# Patient Record
Sex: Female | Born: 1960 | Race: White | Hispanic: No | Marital: Married | Smoking: Former smoker
Health system: Southern US, Community
[De-identification: ages and names within clinical notes are randomized; demographics above are authoritative.]

## PROBLEM LIST (undated history)

## (undated) DIAGNOSIS — M1990 Unspecified osteoarthritis, unspecified site: Secondary | ICD-10-CM

## (undated) DIAGNOSIS — J189 Pneumonia, unspecified organism: Secondary | ICD-10-CM

## (undated) DIAGNOSIS — R51 Headache: Secondary | ICD-10-CM

## (undated) DIAGNOSIS — Z9889 Other specified postprocedural states: Secondary | ICD-10-CM

## (undated) DIAGNOSIS — F32A Depression, unspecified: Secondary | ICD-10-CM

## (undated) DIAGNOSIS — K219 Gastro-esophageal reflux disease without esophagitis: Secondary | ICD-10-CM

## (undated) DIAGNOSIS — M797 Fibromyalgia: Secondary | ICD-10-CM

## (undated) DIAGNOSIS — R002 Palpitations: Secondary | ICD-10-CM

## (undated) DIAGNOSIS — M419 Scoliosis, unspecified: Secondary | ICD-10-CM

## (undated) DIAGNOSIS — F419 Anxiety disorder, unspecified: Secondary | ICD-10-CM

## (undated) DIAGNOSIS — E781 Pure hyperglyceridemia: Secondary | ICD-10-CM

## (undated) DIAGNOSIS — E039 Hypothyroidism, unspecified: Secondary | ICD-10-CM

## (undated) DIAGNOSIS — F988 Other specified behavioral and emotional disorders with onset usually occurring in childhood and adolescence: Secondary | ICD-10-CM

## (undated) DIAGNOSIS — A4902 Methicillin resistant Staphylococcus aureus infection, unspecified site: Secondary | ICD-10-CM

## (undated) DIAGNOSIS — G4700 Insomnia, unspecified: Secondary | ICD-10-CM

## (undated) DIAGNOSIS — N289 Disorder of kidney and ureter, unspecified: Secondary | ICD-10-CM

## (undated) DIAGNOSIS — F329 Major depressive disorder, single episode, unspecified: Secondary | ICD-10-CM

## (undated) DIAGNOSIS — R52 Pain, unspecified: Secondary | ICD-10-CM

## (undated) DIAGNOSIS — D649 Anemia, unspecified: Secondary | ICD-10-CM

## (undated) DIAGNOSIS — I10 Essential (primary) hypertension: Secondary | ICD-10-CM

## (undated) DIAGNOSIS — R112 Nausea with vomiting, unspecified: Secondary | ICD-10-CM

## (undated) HISTORY — PX: TUBAL LIGATION: SHX77

## (undated) HISTORY — PX: ABDOMINAL HYSTERECTOMY: SHX81

---

## 1998-04-07 ENCOUNTER — Other Ambulatory Visit: Admission: RE | Admit: 1998-04-07 | Discharge: 1998-04-07 | Payer: Self-pay | Admitting: Obstetrics and Gynecology

## 1999-03-30 ENCOUNTER — Encounter: Payer: Self-pay | Admitting: Emergency Medicine

## 1999-03-30 ENCOUNTER — Emergency Department (HOSPITAL_COMMUNITY): Admission: EM | Admit: 1999-03-30 | Discharge: 1999-03-30 | Payer: Self-pay | Admitting: Emergency Medicine

## 1999-04-11 ENCOUNTER — Other Ambulatory Visit: Admission: RE | Admit: 1999-04-11 | Discharge: 1999-04-11 | Payer: Self-pay | Admitting: Obstetrics and Gynecology

## 2001-05-12 ENCOUNTER — Other Ambulatory Visit: Admission: RE | Admit: 2001-05-12 | Discharge: 2001-05-12 | Payer: Self-pay | Admitting: Obstetrics & Gynecology

## 2001-05-15 ENCOUNTER — Ambulatory Visit (HOSPITAL_COMMUNITY): Admission: RE | Admit: 2001-05-15 | Discharge: 2001-05-15 | Payer: Self-pay | Admitting: Obstetrics and Gynecology

## 2002-07-09 ENCOUNTER — Other Ambulatory Visit: Admission: RE | Admit: 2002-07-09 | Discharge: 2002-07-09 | Payer: Self-pay | Admitting: Obstetrics and Gynecology

## 2003-07-12 ENCOUNTER — Other Ambulatory Visit: Admission: RE | Admit: 2003-07-12 | Discharge: 2003-07-12 | Payer: Self-pay | Admitting: Obstetrics and Gynecology

## 2004-08-14 ENCOUNTER — Other Ambulatory Visit: Admission: RE | Admit: 2004-08-14 | Discharge: 2004-08-14 | Payer: Self-pay | Admitting: Obstetrics and Gynecology

## 2006-04-15 ENCOUNTER — Inpatient Hospital Stay (HOSPITAL_COMMUNITY): Admission: RE | Admit: 2006-04-15 | Discharge: 2006-04-17 | Payer: Self-pay | Admitting: Obstetrics and Gynecology

## 2009-06-04 HISTORY — PX: CERVICAL FUSION: SHX112

## 2010-07-04 ENCOUNTER — Other Ambulatory Visit: Payer: Self-pay | Admitting: Obstetrics and Gynecology

## 2011-02-16 ENCOUNTER — Encounter (HOSPITAL_COMMUNITY)
Admission: RE | Admit: 2011-02-16 | Discharge: 2011-02-16 | Disposition: A | Discharge status: Home / Self Care | Payer: BC Managed Care – PPO | Source: Ambulatory Visit | Attending: Neurosurgery | Admitting: Neurosurgery

## 2011-02-16 ENCOUNTER — Other Ambulatory Visit (HOSPITAL_COMMUNITY): Payer: Self-pay | Admitting: Neurosurgery

## 2011-02-16 DIAGNOSIS — M47812 Spondylosis without myelopathy or radiculopathy, cervical region: Secondary | ICD-10-CM

## 2011-02-16 DIAGNOSIS — M4802 Spinal stenosis, cervical region: Secondary | ICD-10-CM

## 2011-02-16 LAB — CBC
HCT: 35.5 % — ABNORMAL LOW (ref 36.0–46.0)
Hemoglobin: 12.3 g/dL (ref 12.0–15.0)
MCH: 33.1 pg (ref 26.0–34.0)
MCHC: 34.6 g/dL (ref 30.0–36.0)
MCV: 95.4 fL (ref 78.0–100.0)
Platelets: 509 10*3/uL — ABNORMAL HIGH (ref 150–400)
RBC: 3.72 MIL/uL — ABNORMAL LOW (ref 3.87–5.11)
RDW: 12.7 % (ref 11.5–15.5)
WBC: 6.6 10*3/uL (ref 4.0–10.5)

## 2011-02-16 LAB — BASIC METABOLIC PANEL
BUN: 20 mg/dL (ref 6–23)
CO2: 33 mEq/L — ABNORMAL HIGH (ref 19–32)
Calcium: 9.5 mg/dL (ref 8.4–10.5)
Chloride: 96 mEq/L (ref 96–112)
Creatinine, Ser: 1.27 mg/dL — ABNORMAL HIGH (ref 0.50–1.10)
GFR calc Af Amer: 53 mL/min — ABNORMAL LOW (ref >60)
GFR calc non Af Amer: 44 mL/min — ABNORMAL LOW (ref >60)
Glucose, Bld: 85 mg/dL (ref 70–99)
Potassium: 3.8 mEq/L (ref 3.5–5.1)
Sodium: 137 mEq/L (ref 135–145)

## 2011-02-16 LAB — TYPE AND SCREEN
ABO/RH(D): O POS
Antibody Screen: NEGATIVE

## 2011-02-21 ENCOUNTER — Inpatient Hospital Stay (HOSPITAL_COMMUNITY): Payer: BC Managed Care – PPO

## 2011-02-21 ENCOUNTER — Ambulatory Visit (HOSPITAL_COMMUNITY)
Admission: RE | Admit: 2011-02-21 | Discharge: 2011-02-24 | Disposition: A | Discharge status: Home / Self Care | Payer: BC Managed Care – PPO | Source: Ambulatory Visit | Attending: Neurosurgery | Admitting: Neurosurgery

## 2011-02-21 DIAGNOSIS — M4712 Other spondylosis with myelopathy, cervical region: Principal | ICD-10-CM | POA: Insufficient documentation

## 2011-02-21 DIAGNOSIS — M5000 Cervical disc disorder with myelopathy, unspecified cervical region: Secondary | ICD-10-CM | POA: Insufficient documentation

## 2011-02-21 DIAGNOSIS — Z01812 Encounter for preprocedural laboratory examination: Secondary | ICD-10-CM | POA: Insufficient documentation

## 2011-02-21 DIAGNOSIS — Z01810 Encounter for preprocedural cardiovascular examination: Secondary | ICD-10-CM | POA: Insufficient documentation

## 2011-02-21 DIAGNOSIS — I10 Essential (primary) hypertension: Secondary | ICD-10-CM | POA: Insufficient documentation

## 2011-02-21 DIAGNOSIS — Z01818 Encounter for other preprocedural examination: Secondary | ICD-10-CM | POA: Insufficient documentation

## 2011-02-21 DIAGNOSIS — IMO0001 Reserved for inherently not codable concepts without codable children: Secondary | ICD-10-CM | POA: Insufficient documentation

## 2011-03-01 NOTE — Op Note (Signed)
NAME:  Stephanie Cobb, Stephanie Cobb NO.:  [id number]  MEDICAL RECORD NO.:  [id number]  LOCATION:  3535                         FACILITY:  [facility]  PHYSICIAN:  Cristi Loron, M.D.DATE OF BIRTH:  [birth year]  DATE OF PROCEDURE:  02/21/2011 DATE OF DISCHARGE:                              OPERATIVE REPORT   BRIEF HISTORY:  The patient is a 50 year old white female who has suffered from neck, shoulder, and arm pain consistent with a cervical radiculopathy.  She has failed medical management, worked up with a cervical MRI which demonstrated she had multilevel disk degeneration, spondylolisthesis, stenosis, etc.  I discussed the various treatment options with the patient including surgery.  She has weighed the risks, benefits, and alternatives to surgery and decided to proceed with a four- level anterior cervical diskectomy, fusion, and plating.  PREOPERATIVE DIAGNOSES:  C3-4, C4-5, C5-6, C6-7 spondylosis, disk degeneration, stenosis, spondylolisthesis, cervicalgia, cervical radiculopathy/myelopathy.  POSTOPERATIVE DIAGNOSES:  C3-4, C4-5, C5-6, C6-7 spondylosis, disk degeneration, stenosis, spondylolisthesis, cervicalgia, cervical radiculopathy/myelopathy.  PROCEDURE:  C3-4, C4-5, C5-6, C6-7 extensive anterior cervical diskectomy/decompression; C3-4, C4-5, C5-6, C6-7 anterior interbody arthrodesis with local morselized autograft bone and Actifuse bone graft extender; insertion of interbody prosthesis at C3-4, C4-5, C5-6, C6-7 (Zimmer PEEK interbody prosthesis); anterior cervical plating C3 and C7 with Globus titanium plate and screws.  SURGEON:  Cristi Loron, MD  ASSISTANT:  Hewitt Shorts, MD  ANESTHESIA:  General endotracheal.  ESTIMATED BLOOD LOSS:  150 mL.  SPECIMENS:  None.  DRAINS:  One prevertebral Jackson-Pratt drain.  COMPLICATIONS:  None.  DESCRIPTION OF PROCEDURE:  The patient was brought to the operating room by anesthesia team.  General  endotracheal anesthesia was induced.  The patient remained in supine position.  A roll was placed under her shoulders to keep her neck in the neutral position.  Her anterior cervical region was then prepared with Betadine scrub and Betadine solution.  Sterile drapes were applied.  I then injected the area to be incised with Marcaine with epinephrine solution.  A scalpel to make a transverse incision in the patient's left anterior neck.  I used Metzenbaum scissors to divide the platysma muscle and then to dissect medial to sternocleidomastoid muscle, jugular vein, and carotid artery. I carefully dissected down towards the anterior cervical spine identifying the esophagus, retracting it medially.  I then used Kittner swabs to clear the soft tissue from the anterior cervical spine, inserted a bent spinal needle into one of the exposed intervertebral disk spaces.  We then obtained intraoperative radiograph to confirm our location.  I then used electrocautery to detach the medial border of the longus colli muscle bilaterally at C3-4, C4-5, C5-6, and C6-7 intervertebral disk spaces.  I then inserted a Caspar self-retaining retractor underneath the longus colli muscle to provide exposure.  We began the decompression by incising the intervertebral disk at C6-7 with a 15 blade scalpel.  The disk space was quite spondylotic.  We inserted distraction screws at C6-C7 and then distracted the interspace. I then used high-speed drill to decorticate the vertebral endplates at C6-C7, to drill away the remainder of C6-7 intervertebral disk, to drill away posterior some spondylosis, and to thin out  the posterior longitudinal ligament.  I then incised the ligament with arachnoid knife and then removed it with Kerrison punch undercutting the vertebral endplates at C6-7 and then performed the foraminotomy about the bilateral C7 nerve roots completing the decompression at this level.  I then repeated the  procedure in an analogous fashion at C5-6 and C4-5, and C3-4 decompressing the thecal sac at each of these levels as well as the bilateral C6, C5, and C4 nerve roots.  Having completed the decompression, we now turned our attention to the arthrodesis.  We used trial spacers and determined to use a 6-mm interbody prosthesis at C3-4 but a 7-mm prosthesis at rest of the levels.  We then prefilled these prosthesis with combination of local autograft bone we obtained during the decompression as well as Actifuse bone graft extender.  We inserted the prosthesis into the distracted interspaces and then removed distraction screws.  There was a good snug fit of the prosthesis at each level.  This completed the arthrodesis.  We now turned our attention to the anterior spinal instrumentation.  We used high-speed drill to drill away some ventral spondylosis from the vertebral endplates at C3-4, C4-5, C5-6, and C6-7 so that the plate would lay down flat.  We selected appropriate length Globus titanium plate and laid it along the anterior aspect of the vertebral bodies from C3 to C7.  We then drilled two 12-mm holes at C3, C4, C5, C6, and C7. We then secured the plate to the vertebral bodies by placing two 12-mm self-tapping screws at C3, C4, C5, C6, and C7.  We got good bony purchase.  We then obtained intraoperative radiograph which demonstrated good positioning of plate, screws, interbody prosthesis.  We therefore secured the screws and plate by locking each cam.  This completed the instrumentation.  We then obtained hemostasis using bipolar electrocautery.  We irrigated the wound out with bacitracin solution.  We then inspected the esophagus for any damage, none apparent.  We then placed a 10-mm flat Al Pimple drain in the prevertebral space.  We tunneled out through a separate stab wound.  We then secured the drain at the exit site with a 3-0 nylon suture.  We then reapproximated the  patient's platysma muscle with interrupted 3-0 Vicryl suture, the subcutaneous tissue with interrupted 3-0 Vicryl suture, and the skin with Steri-Strips and Benzoin.  The wound was then coated with bacitracin ointment and sterile dressing was applied.  The drapes were removed, and the patient was subsequently extubated by the anesthesia team and transported to post anesthesia care unit in stable condition.  All sponge, instrument, and needle counts were correct at the end of the case.     Cristi Loron, M.D.     JDJ/MEDQ  D:  02/21/2011  T:  02/22/2011  Job:  [id number]  Electronically Signed by Tressie Stalker M.D. on 03/01/2011 09:42:08 AM

## 2013-10-29 DIAGNOSIS — M160 Bilateral primary osteoarthritis of hip: Secondary | ICD-10-CM

## 2013-10-29 HISTORY — DX: Bilateral primary osteoarthritis of hip: M16.0

## 2013-12-30 ENCOUNTER — Ambulatory Visit (HOSPITAL_COMMUNITY)
Admission: RE | Admit: 2013-12-30 | Discharge: 2013-12-30 | Disposition: A | Discharge status: Home / Self Care | Payer: BC Managed Care – PPO | Source: Ambulatory Visit | Attending: Anesthesiology | Admitting: Anesthesiology

## 2013-12-30 ENCOUNTER — Encounter (INDEPENDENT_AMBULATORY_CARE_PROVIDER_SITE_OTHER): Payer: Self-pay

## 2013-12-30 ENCOUNTER — Encounter (HOSPITAL_COMMUNITY): Payer: Self-pay

## 2013-12-30 ENCOUNTER — Encounter (HOSPITAL_COMMUNITY)
Admission: RE | Admit: 2013-12-30 | Discharge: 2013-12-30 | Disposition: A | Discharge status: Home / Self Care | Payer: BC Managed Care – PPO | Source: Ambulatory Visit | Attending: Orthopedic Surgery | Admitting: Orthopedic Surgery

## 2013-12-30 ENCOUNTER — Encounter (HOSPITAL_COMMUNITY): Payer: Self-pay | Admitting: Pharmacy Technician

## 2013-12-30 DIAGNOSIS — Z01812 Encounter for preprocedural laboratory examination: Secondary | ICD-10-CM | POA: Insufficient documentation

## 2013-12-30 DIAGNOSIS — Z01818 Encounter for other preprocedural examination: Secondary | ICD-10-CM | POA: Insufficient documentation

## 2013-12-30 DIAGNOSIS — Z981 Arthrodesis status: Secondary | ICD-10-CM | POA: Insufficient documentation

## 2013-12-30 DIAGNOSIS — M4120 Other idiopathic scoliosis, site unspecified: Secondary | ICD-10-CM | POA: Insufficient documentation

## 2013-12-30 HISTORY — DX: Anxiety disorder, unspecified: F41.9

## 2013-12-30 HISTORY — DX: Hypothyroidism, unspecified: E03.9

## 2013-12-30 HISTORY — DX: Depression, unspecified: F32.A

## 2013-12-30 HISTORY — DX: Disorder of kidney and ureter, unspecified: N28.9

## 2013-12-30 HISTORY — DX: Nausea with vomiting, unspecified: R11.2

## 2013-12-30 HISTORY — DX: Insomnia, unspecified: G47.00

## 2013-12-30 HISTORY — DX: Pneumonia, unspecified organism: J18.9

## 2013-12-30 HISTORY — DX: Fibromyalgia: M79.7

## 2013-12-30 HISTORY — DX: Gastro-esophageal reflux disease without esophagitis: K21.9

## 2013-12-30 HISTORY — DX: Scoliosis, unspecified: M41.9

## 2013-12-30 HISTORY — DX: Essential (primary) hypertension: I10

## 2013-12-30 HISTORY — DX: Major depressive disorder, single episode, unspecified: F32.9

## 2013-12-30 HISTORY — DX: Unspecified osteoarthritis, unspecified site: M19.90

## 2013-12-30 HISTORY — DX: Palpitations: R00.2

## 2013-12-30 HISTORY — DX: Nausea with vomiting, unspecified: Z98.890

## 2013-12-30 HISTORY — DX: Other specified behavioral and emotional disorders with onset usually occurring in childhood and adolescence: F98.8

## 2013-12-30 HISTORY — DX: Other specified postprocedural states: Z98.890

## 2013-12-30 HISTORY — DX: Headache: R51

## 2013-12-30 HISTORY — DX: Pain, unspecified: R52

## 2013-12-30 HISTORY — DX: Pure hyperglyceridemia: E78.1

## 2013-12-30 LAB — URINALYSIS, ROUTINE W REFLEX MICROSCOPIC
Bilirubin Urine: NEGATIVE
GLUCOSE, UA: NEGATIVE mg/dL
HGB URINE DIPSTICK: NEGATIVE
Ketones, ur: NEGATIVE mg/dL
Leukocytes, UA: NEGATIVE
Nitrite: NEGATIVE
Protein, ur: NEGATIVE mg/dL
Specific Gravity, Urine: 1.022 (ref 1.005–1.030)
Urobilinogen, UA: 0.2 mg/dL (ref 0.0–1.0)
pH: 6 (ref 5.0–8.0)

## 2013-12-30 LAB — CBC
HCT: 36.8 % (ref 36.0–46.0)
Hemoglobin: 12.9 g/dL (ref 12.0–15.0)
MCH: 33 pg (ref 26.0–34.0)
MCHC: 35.1 g/dL (ref 30.0–36.0)
MCV: 94.1 fL (ref 78.0–100.0)
PLATELETS: 391 10*3/uL (ref 150–400)
RBC: 3.91 MIL/uL (ref 3.87–5.11)
RDW: 12.2 % (ref 11.5–15.5)
WBC: 5.8 10*3/uL (ref 4.0–10.5)

## 2013-12-30 LAB — BASIC METABOLIC PANEL
Anion gap: 12 (ref 5–15)
BUN: 20 mg/dL (ref 6–23)
CO2: 27 mEq/L (ref 19–32)
Calcium: 9.4 mg/dL (ref 8.4–10.5)
Chloride: 102 mEq/L (ref 96–112)
Creatinine, Ser: 1.01 mg/dL (ref 0.50–1.10)
GFR calc Af Amer: 71 mL/min — ABNORMAL LOW (ref >90)
GFR, EST NON AFRICAN AMERICAN: 61 mL/min — AB (ref >90)
Glucose, Bld: 84 mg/dL (ref 70–99)
Potassium: 5.1 mEq/L (ref 3.7–5.3)
Sodium: 141 mEq/L (ref 137–147)

## 2013-12-30 LAB — PROTIME-INR
INR: 0.95 (ref 0.00–1.49)
PROTHROMBIN TIME: 12.7 s (ref 11.6–15.2)

## 2013-12-30 LAB — SURGICAL PCR SCREEN
MRSA, PCR: NEGATIVE
STAPHYLOCOCCUS AUREUS: POSITIVE — AB

## 2013-12-30 LAB — APTT: aPTT: 29 seconds (ref 24–37)

## 2013-12-30 NOTE — Patient Instructions (Addendum)
YOUR SURGERY IS SCHEDULED AT [facility]  ON:  Tuesday  8/11  REPORT TO  SHORT STAY CENTER AT:  11:20 AM   PLEASE COME IN THE [facility] MAIN HOSPITAL ENTRANCE AND FOLLOW SIGNS TO SHORT STAY CENTER.  DO NOT EAT ANYTHING AFTER MIDNIGHT THE NIGHT BEFORE YOUR SURGERY.   NO FOOD, NO CHEWING GUM, NO MINTS, NO CANDIES, NO CHEWING TOBACCO. YOU MAY HAVE CLEAR LIQUIDS TO DRINK FROM MIDNIGHT UNTIL 8:20 AM DAY OF YOUR SURGERY - LIKE WATER, COKE.   NOTHING TO DRINK AFTER 8:20 AM.  PLEASE TAKE THE FOLLOWING MEDICATIONS THE AM OF YOUR SURGERY WITH A FEW SIPS OF WATER:  WELLBUTRIN, DIAZEPAM IF NEEDED, CYMBALTA, GABAPENTIN, SYNTHROID, DEMEROL IF NEEDED FOR PAIN, SINGULAIR, MAXALT IF MIGRAINE.  DO NOT BRING VALUABLES, MONEY, CREDIT CARDS.  DO NOT WEAR JEWELRY, MAKE-UP, NAIL POLISH AND NO METAL PINS OR CLIPS IN YOUR HAIR. CONTACT LENS, DENTURES / PARTIALS, GLASSES SHOULD NOT BE WORN TO SURGERY AND IN MOST CASES-HEARING AIDS WILL NEED TO BE REMOVED.  BRING YOUR GLASSES CASE, ANY EQUIPMENT NEEDED FOR YOUR CONTACT LENS. FOR PATIENTS ADMITTED TO THE HOSPITAL--CHECK OUT TIME THE DAY OF DISCHARGE IS 11:00 AM.  ALL INPATIENT ROOMS ARE PRIVATE - WITH BATHROOM, TELEPHONE, TELEVISION AND WIFI INTERNET.                                                    PLEASE READ OVER ANY  FACT SHEETS THAT YOU WERE GIVEN: MRSA INFORMATION, BLOOD TRANSFUSION INFORMATION, INCENTIVE SPIROMETER INFORMATION.  PLEASE BE AWARE THAT YOU MAY NEED ADDITIONAL BLOOD DRAWN DAY OF YOUR SURGERY  _______________________________________________________________________   [facility] - Preparing for Surgery Before surgery, you can play an important role.  Because skin is not sterile, your skin needs to be as free of germs as possible.  You can reduce the number of germs on your skin by washing with CHG (chlorahexidine gluconate) soap before surgery.  CHG is an antiseptic cleaner which kills germs and bonds with the skin to continue killing  germs even after washing. Please DO NOT use if you have an allergy to CHG or antibacterial soaps.  If your skin becomes reddened/irritated stop using the CHG and inform your nurse when you arrive at Short Stay. Do not shave (including legs and underarms) for at least 48 hours prior to the first CHG shower.  You may shave your face/neck. Please follow these instructions carefully:  1.  Shower with CHG Soap the night before surgery and the  morning of Surgery.  2.  If you choose to wash your hair, wash your hair first as usual with your  normal  shampoo.  3.  After you shampoo, rinse your hair and body thoroughly to remove the  shampoo.                           4.  Use CHG as you would any other liquid soap.  You can apply chg directly  to the skin and wash                       Gently with a scrungie or clean washcloth.  5.  Apply the CHG Soap to your body ONLY FROM THE NECK DOWN.   Do not use on face/  open                           Wound or open sores. Avoid contact with eyes, ears mouth and genitals (private parts).                       Wash face,  Genitals (private parts) with your normal soap.             6.  Wash thoroughly, paying special attention to the area where your surgery  will be performed.  7.  Thoroughly rinse your body with warm water from the neck down.  8.  DO NOT shower/wash with your normal soap after using and rinsing off  the CHG Soap.                9.  Pat yourself dry with a clean towel.            10.  Wear clean pajamas.            11.  Place clean sheets on your bed the night of your first shower and do not  sleep with pets. Day of Surgery : Do not apply any lotions/deodorants the morning of surgery.  Please wear clean clothes to the hospital/surgery center.  FAILURE TO FOLLOW THESE INSTRUCTIONS MAY RESULT IN THE CANCELLATION OF YOUR SURGERY PATIENT SIGNATURE_________________________________  NURSE  SIGNATURE__________________________________  ________________________________________________________________________   Stephanie Cobb  An incentive spirometer is a tool that can help keep your lungs clear and active. This tool measures how well you are filling your lungs with each breath. Taking long deep breaths may help reverse or decrease the chance of developing breathing (pulmonary) problems (especially infection) following:  A long period of time when you are unable to move or be active. BEFORE THE PROCEDURE   If the spirometer includes an indicator to show your best effort, your nurse or respiratory therapist will set it to a desired goal.  If possible, sit up straight or lean slightly forward. Try not to slouch.  Hold the incentive spirometer in an upright position. INSTRUCTIONS FOR USE  1. Sit on the edge of your bed if possible, or sit up as far as you can in bed or on a chair. 2. Hold the incentive spirometer in an upright position. 3. Breathe out normally. 4. Place the mouthpiece in your mouth and seal your lips tightly around it. 5. Breathe in slowly and as deeply as possible, raising the piston or the ball toward the top of the column. 6. Hold your breath for 3-5 seconds or for as long as possible. Allow the piston or ball to fall to the bottom of the column. 7. Remove the mouthpiece from your mouth and breathe out normally. 8. Rest for a few seconds and repeat Steps 1 through 7 at least 10 times every 1-2 hours when you are awake. Take your time and take a few normal breaths between deep breaths. 9. The spirometer may include an indicator to show your best effort. Use the indicator as a goal to work toward during each repetition. 10. After each set of 10 deep breaths, practice coughing to be sure your lungs are clear. If you have an incision (the cut made at the time of surgery), support your incision when coughing by placing a pillow or rolled up towels firmly  against it. Once you are able to get out of bed, walk around indoors  and cough well. You may stop using the incentive spirometer when instructed by your caregiver.  RISKS AND COMPLICATIONS  Take your time so you do not get dizzy or light-headed.  If you are in pain, you may need to take or ask for pain medication before doing incentive spirometry. It is harder to take a deep breath if you are having pain. AFTER USE  Rest and breathe slowly and easily.  It can be helpful to keep track of a log of your progress. Your caregiver can provide you with a simple table to help with this. If you are using the spirometer at home, follow these instructions: SEEK MEDICAL CARE IF:   You are having difficultly using the spirometer.  You have trouble using the spirometer as often as instructed.  Your pain medication is not giving enough relief while using the spirometer.  You develop fever of 100.5 F (38.1 C) or higher. SEEK IMMEDIATE MEDICAL CARE IF:   You cough up bloody sputum that had not been present before.  You develop fever of 102 F (38.9 C) or greater.  You develop worsening pain at or near the incision site. MAKE SURE YOU:   Understand these instructions.  Will watch your condition.  Will get help right away if you are not doing well or get worse. Document Released: 10/01/2006 Document Revised: 08/13/2011 Document Reviewed: 12/02/2006 ExitCare Patient Information 2014 ExitCare, [place].   ________________________________________________________________________  WHAT IS A BLOOD TRANSFUSION? Blood Transfusion Information  A transfusion is the replacement of blood or some of its parts. Blood is made up of multiple cells which provide different functions.  Red blood cells carry oxygen and are used for blood loss replacement.  White blood cells fight against infection.  Platelets control bleeding.  Plasma helps clot blood.  Other blood products are available for  specialized needs, such as hemophilia or other clotting disorders. BEFORE THE TRANSFUSION  Who gives blood for transfusions?   Healthy volunteers who are fully evaluated to make sure their blood is safe. This is blood bank blood. Transfusion therapy is the safest it has ever been in the practice of medicine. Before blood is taken from a donor, a complete history is taken to make sure that person has no history of diseases nor engages in risky social behavior (examples are intravenous drug use or sexual activity with multiple partners). The donor's travel history is screened to minimize risk of transmitting infections, such as malaria. The donated blood is tested for signs of infectious diseases, such as HIV and hepatitis. The blood is then tested to be sure it is compatible with you in order to minimize the chance of a transfusion reaction. If you or a relative donates blood, this is often done in anticipation of surgery and is not appropriate for emergency situations. It takes many days to process the donated blood. RISKS AND COMPLICATIONS Although transfusion therapy is very safe and saves many lives, the main dangers of transfusion include:   Getting an infectious disease.  Developing a transfusion reaction. This is an allergic reaction to something in the blood you were given. Every precaution is taken to prevent this. The decision to have a blood transfusion has been considered carefully by your caregiver before blood is given. Blood is not given unless the benefits outweigh the risks. AFTER THE TRANSFUSION  Right after receiving a blood transfusion, you will usually feel much better and more energetic. This is especially true if your red blood cells have gotten low (anemic).  The transfusion raises the level of the red blood cells which carry oxygen, and this usually causes an energy increase.  The nurse administering the transfusion will monitor you carefully for complications. HOME CARE  INSTRUCTIONS  No special instructions are needed after a transfusion. You may find your energy is better. Speak with your caregiver about any limitations on activity for underlying diseases you may have. SEEK MEDICAL CARE IF:   Your condition is not improving after your transfusion.  You develop redness or irritation at the intravenous (IV) site. SEEK IMMEDIATE MEDICAL CARE IF:  Any of the following symptoms occur over the next 12 hours:  Shaking chills.  You have a temperature by mouth above 102 F (38.9 C), not controlled by medicine.  Chest, back, or muscle pain.  People around you feel you are not acting correctly or are confused.  Shortness of breath or difficulty breathing.  Dizziness and fainting.  You get a rash or develop hives.  You have a decrease in urine output.  Your urine turns a dark color or changes to pink, red, or brown. Any of the following symptoms occur over the next 10 days:  You have a temperature by mouth above 102 F (38.9 C), not controlled by medicine.  Shortness of breath.  Weakness after normal activity.  The white part of the eye turns yellow (jaundice).  You have a decrease in the amount of urine or are urinating less often.  Your urine turns a dark color or changes to pink, red, or brown. Document Released: 05/18/2000 Document Revised: 08/13/2011 Document Reviewed: 01/05/2008 [facility] Patient Information 2014 [place], [place].  _______________________________________________________________________

## 2013-12-30 NOTE — H&P (Signed)
TOTAL HIP ADMISSION H&P  Patient is admitted for left total hip arthroplasty, anterior approach.  Subjective:  Chief Complaint:    Left hip OA / pain  HPI: Stephanie RiserPaula P Creegan, 53 y.o. female, has a history of pain and functional disability in the left hip(s) due to arthritis and patient has failed non-surgical conservative treatments for greater than 12 weeks to include NSAID's and/or analgesics, use of assistive devices and activity modification.  Onset of symptoms was gradual starting 9+ years ago with gradually worsening course since that time.The patient noted no past surgery on the bilateral hip(s).  Patient currently rates pain in the left hip at 10 out of 10 with activity. Patient has night pain, worsening of pain with activity and weight bearing, trendelenberg gait, pain that interfers with activities of daily living and pain with passive range of motion. Patient has evidence of periarticular osteophytes and joint space narrowing by imaging studies. This condition presents safety issues increasing the risk of falls.  There is no current active infection.  Risks, benefits and expectations were discussed with the patient.  Risks including but not limited to the risk of anesthesia, blood clots, nerve damage, blood vessel damage, failure of the prosthesis, infection and up to and including death.  Patient understand the risks, benefits and expectations and wishes to proceed with surgery.   PCP: MOON,AMY, NP  D/C Plans:      Home with HHPT  Post-op Meds:       No Rx given  Tranexamic Acid:      To be given - IV    Decadron:      Is to be given  FYI:     Vancomycin antibiotic (Previous bad MRSA infection)  ASA post-op  Norco post-op    Past Medical History  Diagnosis Date  . Hypertension   . Heart palpitations     [facility], EF 74%  . Hypothyroidism   . Arthritis     OA / PAIN BOTH HIPS  . Pain     CHRONIC NECK PAIN - DDD  CERVICOTHORACIC; HX OF CERVICA FUSION - PT STATES 2 AREAS HAVE NOT FUSED AND SHE WILL NEED FURTHER SURGERY IN THE FUTURE  . Fibromyalgia   . Headache(784.0)     MIGRAINES  . Hypertriglyceridemia   . ADD (attention deficit disorder)   . GERD (gastroesophageal reflux disease)   . Insomnia   . PONV (postoperative nausea and vomiting)   . Pneumonia     ABOUT 6 YRS AGO  . Depression   . Anxiety   . Kidney insufficiency     PT STATES BUN SLIGHTLY ELEVATED - HER DOCTORS ARE WATCHING  . Scoliosis     Past Surgical History  Procedure Laterality Date  . Tubal ligation    . Abdominal hysterectomy    . Cervical fusion  2011    No prescriptions prior to admission   No Known Allergies   History  Substance Use Topics  . Smoking status: Former Smoker -- 1.00 packs/day for 10 years    Types: Cigarettes  . Smokeless tobacco: Never Used  . Alcohol Use: No     Comment: QUIT SMOKING  ABOUT 6 YRS AGO       Review of Systems  Constitutional: Negative.   Eyes: Negative.   Respiratory: Negative.   Cardiovascular: Negative.   Gastrointestinal: Positive for heartburn.  Genitourinary: Negative.   Musculoskeletal: Positive for joint pain and neck pain.  Skin:  Negative.   Neurological: Positive for headaches.  Endo/Heme/Allergies: Negative.   Psychiatric/Behavioral: Positive for depression. The patient is nervous/anxious.     Objective:  Physical Exam  Constitutional: She is oriented to person, place, and time. She appears well-developed and well-nourished.  HENT:  Head: Normocephalic and atraumatic.  Mouth/Throat: Oropharynx is clear and moist.  Eyes: Pupils are equal, round, and reactive to light.  Neck: Neck supple. No JVD present. No tracheal deviation present. No thyromegaly present.  Cardiovascular: Normal rate, regular rhythm, normal heart sounds and intact distal pulses.   Respiratory: Effort normal and breath sounds normal. No stridor. No respiratory distress. She has no  wheezes.  GI: Soft. There is no tenderness. There is no guarding.  Musculoskeletal:       Left hip: She exhibits decreased range of motion, decreased strength, tenderness and bony tenderness. She exhibits no swelling, no deformity and no laceration.  Lymphadenopathy:    She has no cervical adenopathy.  Neurological: She is alert and oriented to person, place, and time.  Skin: Skin is warm and dry.  Psychiatric: She has a normal mood and affect.    Vital signs in last 24 hours: Temp:  [98.1 F (36.7 C)] 98.1 F (36.7 C) (07/29 0730) Pulse Rate:  [59] 59 (07/29 0730) Resp:  [16] 16 (07/29 0730) BP: (159)/(91) 159/91 mmHg (07/29 0730) SpO2:  [100 %] 100 % (07/29 0730) Weight:  [75.297 kg (166 lb)] 75.297 kg (166 lb) (07/29 0730)    Imaging Review Plain radiographs demonstrate severe degenerative joint disease of the bilateral hip(s). The bone quality appears to be good for age and reported activity level.  Assessment/Plan:  End stage arthritis, left hip(s)  The patient history, physical examination, clinical judgement of the provider and imaging studies are consistent with end stage degenerative joint disease of the left hip(s) and total hip arthroplasty is deemed medically necessary. The treatment options including medical management, injection therapy, arthroscopy and arthroplasty were discussed at length. The risks and benefits of total hip arthroplasty were presented and reviewed. The risks due to aseptic loosening, infection, stiffness, dislocation/subluxation,  thromboembolic complications and other imponderables were discussed.  The patient acknowledged the explanation, agreed to proceed with the plan and consent was signed. Patient is being admitted for inpatient treatment for surgery, pain control, PT, OT, prophylactic antibiotics, VTE prophylaxis, progressive ambulation and ADL's and discharge planning.The patient is planning to be discharged home with home health  services.    [name] Demosthenes Virnig   PA-C  12/30/2013, 6:01 PM

## 2013-12-30 NOTE — Pre-Procedure Instructions (Signed)
EKG 12-06-13 AND [facility] NUCLEAR STRESS TEST RE[facility]. MEDICAL CLEARANCE ON CHART FROM AMY MOON, NP. [facility]

## 2014-01-01 NOTE — Pre-Procedure Instructions (Signed)
PT CALLED AND STATES THAT AFTER SHE PUT MUPIROCIN OINTMENT IN HER NOSE LAST NIGHT - SHE EXPERIENCED SEVERE PAIN RIGHT SIDE OF HER HEAD, PAIN IN HER RIGHT EAR AND NUMBNESS RIGHT SIDE OF HER HEAD AND SEVERE DIZZINESS.  STATES SHE TOOK MAXALT THAT SHE TAKES FOR MIGRAINES BUT NO RELIEF.  STATES SHE TOOK VALIUM AND BETTER ABLE TO TOLERATE THE PAIN BUT STILL HURTING TODAY.  STATES HER HUSBAND TOLD HER TO CALL ME.  I INSTRUCTED PT NOT TO USE ANY MORE MUPIROCIN.  I INSTRUCTED PT THAT SHE SHOULD GO TO THE ER TO BE EVALUATED BY A DOCTOR TO BE SURE SHE DOESN'T HAVE SOMETHING ELSE GOING ON- SUCH AS STROKE OR ANEURYSM.  I HAVE CALLED PT'S HUSBAND AND LEFT HIM MESSAGE ON HIS CELL PHONE TO LET HIM KNOW THAT I HAVE ADVISED HIS WIFE TO GO TO ER TO BE EVALUATED AND NOT TO USE THE MUPIROCIN OINTMENT.

## 2014-01-04 NOTE — Pre-Procedure Instructions (Signed)
SPOKE WITH PATIENT BY PHONE - SHE STATES SHE IS FEELING SO MUCH BETTER- STATES HER HEAD FEELS FINE NOW - SHE IS NOT USING THE MUPIROCIN.  STATES SHE DID NOT GO TO ER - STATES SHE KNOWS HER BODY AND DID NOT THINK SHE WAS HAVING A STROKE.  SHE FEELS THAT SHE WAS HAVING ALLERGIC REACTION TO THE MUPIROCIN.

## 2014-01-12 ENCOUNTER — Encounter (HOSPITAL_COMMUNITY)
Admission: RE | Disposition: A | Discharge status: Home Health | Payer: Self-pay | Source: Ambulatory Visit | Attending: Orthopedic Surgery

## 2014-01-12 ENCOUNTER — Inpatient Hospital Stay (HOSPITAL_COMMUNITY)
Admission: RE | Admit: 2014-01-12 | Discharge: 2014-01-14 | DRG: 470 | Disposition: A | Discharge status: Home Health | Payer: BC Managed Care – PPO | Source: Ambulatory Visit | Attending: Orthopedic Surgery | Admitting: Orthopedic Surgery

## 2014-01-12 ENCOUNTER — Encounter (HOSPITAL_COMMUNITY): Payer: Self-pay | Admitting: *Deleted

## 2014-01-12 ENCOUNTER — Inpatient Hospital Stay (HOSPITAL_COMMUNITY): Payer: BC Managed Care – PPO

## 2014-01-12 ENCOUNTER — Encounter (HOSPITAL_COMMUNITY): Payer: BC Managed Care – PPO | Admitting: Certified Registered"

## 2014-01-12 ENCOUNTER — Inpatient Hospital Stay (HOSPITAL_COMMUNITY)
Admission: RE | Admit: 2014-01-12 | Payer: BC Managed Care – PPO | Source: Ambulatory Visit | Admitting: Orthopedic Surgery

## 2014-01-12 ENCOUNTER — Inpatient Hospital Stay (HOSPITAL_COMMUNITY): Payer: BC Managed Care – PPO | Admitting: Certified Registered"

## 2014-01-12 DIAGNOSIS — F988 Other specified behavioral and emotional disorders with onset usually occurring in childhood and adolescence: Secondary | ICD-10-CM | POA: Diagnosis present

## 2014-01-12 DIAGNOSIS — Z8614 Personal history of Methicillin resistant Staphylococcus aureus infection: Secondary | ICD-10-CM

## 2014-01-12 DIAGNOSIS — Z87891 Personal history of nicotine dependence: Secondary | ICD-10-CM | POA: Diagnosis not present

## 2014-01-12 DIAGNOSIS — F3289 Other specified depressive episodes: Secondary | ICD-10-CM | POA: Diagnosis present

## 2014-01-12 DIAGNOSIS — K219 Gastro-esophageal reflux disease without esophagitis: Secondary | ICD-10-CM | POA: Diagnosis present

## 2014-01-12 DIAGNOSIS — M1610 Unilateral primary osteoarthritis, unspecified hip: Secondary | ICD-10-CM | POA: Diagnosis present

## 2014-01-12 DIAGNOSIS — Z6825 Body mass index (BMI) 25.0-25.9, adult: Secondary | ICD-10-CM

## 2014-01-12 DIAGNOSIS — IMO0001 Reserved for inherently not codable concepts without codable children: Secondary | ICD-10-CM | POA: Diagnosis present

## 2014-01-12 DIAGNOSIS — G4700 Insomnia, unspecified: Secondary | ICD-10-CM | POA: Diagnosis present

## 2014-01-12 DIAGNOSIS — M169 Osteoarthritis of hip, unspecified: Secondary | ICD-10-CM | POA: Diagnosis present

## 2014-01-12 DIAGNOSIS — M25559 Pain in unspecified hip: Secondary | ICD-10-CM | POA: Diagnosis present

## 2014-01-12 DIAGNOSIS — E663 Overweight: Secondary | ICD-10-CM | POA: Diagnosis present

## 2014-01-12 DIAGNOSIS — F411 Generalized anxiety disorder: Secondary | ICD-10-CM | POA: Diagnosis present

## 2014-01-12 DIAGNOSIS — Z96649 Presence of unspecified artificial hip joint: Secondary | ICD-10-CM

## 2014-01-12 DIAGNOSIS — F329 Major depressive disorder, single episode, unspecified: Secondary | ICD-10-CM | POA: Diagnosis present

## 2014-01-12 DIAGNOSIS — E039 Hypothyroidism, unspecified: Secondary | ICD-10-CM | POA: Diagnosis present

## 2014-01-12 DIAGNOSIS — E781 Pure hyperglyceridemia: Secondary | ICD-10-CM | POA: Diagnosis present

## 2014-01-12 DIAGNOSIS — Z981 Arthrodesis status: Secondary | ICD-10-CM | POA: Diagnosis not present

## 2014-01-12 DIAGNOSIS — I10 Essential (primary) hypertension: Secondary | ICD-10-CM | POA: Diagnosis present

## 2014-01-12 DIAGNOSIS — D62 Acute posthemorrhagic anemia: Secondary | ICD-10-CM | POA: Diagnosis not present

## 2014-01-12 HISTORY — DX: Presence of unspecified artificial hip joint: Z96.649

## 2014-01-12 HISTORY — PX: TOTAL HIP ARTHROPLASTY: SHX124

## 2014-01-12 LAB — TYPE AND SCREEN
ABO/RH(D): O POS
Antibody Screen: NEGATIVE

## 2014-01-12 LAB — ABO/RH: ABO/RH(D): O POS

## 2014-01-12 SURGERY — ARTHROPLASTY, HIP, TOTAL, ANTERIOR APPROACH
Anesthesia: Spinal | Site: Hip | Laterality: Left

## 2014-01-12 MED ORDER — CHLORHEXIDINE GLUCONATE 4 % EX LIQD: 60.0000 mL | Freq: Once | CUTANEOUS | Status: DC

## 2014-01-12 MED ORDER — DULOXETINE HCL 60 MG PO CPEP
60.0000 mg | ORAL_CAPSULE | Freq: Every morning | ORAL | Status: DC
  Administered 2014-01-13 – 2014-01-14 (×2): 60 mg via ORAL
  Filled 2014-01-12 (×2): qty 1

## 2014-01-12 MED ORDER — SODIUM CHLORIDE 0.9 % IR SOLN
Status: DC | PRN
  Administered 2014-01-12: 1000 mL

## 2014-01-12 MED ORDER — DOXEPIN HCL 50 MG PO CAPS
50.0000 mg | ORAL_CAPSULE | Freq: Every day | ORAL | Status: DC
  Administered 2014-01-13: 50 mg via ORAL
  Filled 2014-01-12 (×3): qty 1

## 2014-01-12 MED ORDER — RAMIPRIL 5 MG PO CAPS
5.0000 mg | ORAL_CAPSULE | Freq: Every day | ORAL | Status: DC
  Filled 2014-01-12 (×3): qty 1

## 2014-01-12 MED ORDER — DEXAMETHASONE SODIUM PHOSPHATE 10 MG/ML IJ SOLN
10.0000 mg | Freq: Once | INTRAMUSCULAR | Status: AC
  Administered 2014-01-13: 10 mg via INTRAVENOUS
  Filled 2014-01-12: qty 1

## 2014-01-12 MED ORDER — MIDAZOLAM HCL 2 MG/2ML IJ SOLN
INTRAMUSCULAR | Status: AC
  Filled 2014-01-12: qty 2

## 2014-01-12 MED ORDER — VANCOMYCIN HCL IN DEXTROSE 1-5 GM/200ML-% IV SOLN
INTRAVENOUS | Status: AC
  Filled 2014-01-12: qty 200

## 2014-01-12 MED ORDER — TRANEXAMIC ACID 100 MG/ML IV SOLN
1000.0000 mg | Freq: Once | INTRAVENOUS | Status: DC
  Filled 2014-01-12: qty 10

## 2014-01-12 MED ORDER — ONDANSETRON HCL 4 MG PO TABS: 4.0000 mg | ORAL_TABLET | Freq: Four times a day (QID) | ORAL | Status: DC | PRN

## 2014-01-12 MED ORDER — CEFAZOLIN SODIUM-DEXTROSE 2-3 GM-% IV SOLR
INTRAVENOUS | Status: AC
  Filled 2014-01-12: qty 50

## 2014-01-12 MED ORDER — SODIUM CHLORIDE 0.9 % IV SOLN
100.0000 mL/h | INTRAVENOUS | Status: DC
  Administered 2014-01-12: 100 mL/h via INTRAVENOUS
  Filled 2014-01-12 (×7): qty 1000

## 2014-01-12 MED ORDER — ASPIRIN EC 325 MG PO TBEC
325.0000 mg | DELAYED_RELEASE_TABLET | Freq: Two times a day (BID) | ORAL | Status: DC
  Administered 2014-01-13 – 2014-01-14 (×3): 325 mg via ORAL
  Filled 2014-01-12 (×5): qty 1

## 2014-01-12 MED ORDER — PROMETHAZINE HCL 25 MG/ML IJ SOLN: 6.2500 mg | INTRAMUSCULAR | Status: DC | PRN

## 2014-01-12 MED ORDER — VANCOMYCIN HCL IN DEXTROSE 1-5 GM/200ML-% IV SOLN: 1000.0000 mg | Freq: Once | INTRAVENOUS | Status: DC

## 2014-01-12 MED ORDER — PHENYLEPHRINE HCL 10 MG/ML IJ SOLN
INTRAMUSCULAR | Status: DC | PRN
  Administered 2014-01-12 (×2): 40 ug via INTRAVENOUS

## 2014-01-12 MED ORDER — AMPHETAMINE-DEXTROAMPHETAMINE 10 MG PO TABS: 20.0000 mg | ORAL_TABLET | Freq: Two times a day (BID) | ORAL | Status: DC

## 2014-01-12 MED ORDER — CEFAZOLIN SODIUM 1-5 GM-% IV SOLN
1.0000 g | INTRAVENOUS | Status: AC
  Administered 2014-01-12: 2 g via INTRAVENOUS

## 2014-01-12 MED ORDER — GABAPENTIN 300 MG PO CAPS: 300.0000 mg | ORAL_CAPSULE | Freq: Two times a day (BID) | ORAL | Status: DC

## 2014-01-12 MED ORDER — DOCUSATE SODIUM 100 MG PO CAPS
100.0000 mg | ORAL_CAPSULE | Freq: Two times a day (BID) | ORAL | Status: DC
  Administered 2014-01-12 – 2014-01-14 (×4): 100 mg via ORAL

## 2014-01-12 MED ORDER — MENTHOL 3 MG MT LOZG
1.0000 | LOZENGE | OROMUCOSAL | Status: DC | PRN
  Filled 2014-01-12: qty 9

## 2014-01-12 MED ORDER — HYDROMORPHONE HCL PF 1 MG/ML IJ SOLN
0.5000 mg | INTRAMUSCULAR | Status: DC | PRN
  Administered 2014-01-12 – 2014-01-13 (×3): 1 mg via INTRAVENOUS
  Filled 2014-01-12 (×3): qty 1

## 2014-01-12 MED ORDER — DIPHENHYDRAMINE HCL 25 MG PO CAPS: 25.0000 mg | ORAL_CAPSULE | Freq: Four times a day (QID) | ORAL | Status: DC | PRN

## 2014-01-12 MED ORDER — PROPOFOL INFUSION 10 MG/ML OPTIME
INTRAVENOUS | Status: DC | PRN
  Administered 2014-01-12: 20 mL via INTRAVENOUS

## 2014-01-12 MED ORDER — SODIUM CHLORIDE 0.9 % IV SOLN
10.0000 mg | INTRAVENOUS | Status: DC | PRN
  Administered 2014-01-12: 40 ug/min via INTRAVENOUS

## 2014-01-12 MED ORDER — BUPROPION HCL ER (XL) 300 MG PO TB24
300.0000 mg | ORAL_TABLET | Freq: Every morning | ORAL | Status: DC
  Administered 2014-01-13 – 2014-01-14 (×2): 300 mg via ORAL
  Filled 2014-01-12 (×2): qty 1

## 2014-01-12 MED ORDER — BUPIVACAINE HCL (PF) 0.5 % IJ SOLN
INTRAMUSCULAR | Status: DC | PRN
  Administered 2014-01-12: 3 mL

## 2014-01-12 MED ORDER — EPHEDRINE SULFATE 50 MG/ML IJ SOLN
INTRAMUSCULAR | Status: DC | PRN
  Administered 2014-01-12: 5 mg via INTRAVENOUS
  Administered 2014-01-12: 10 mg via INTRAVENOUS
  Administered 2014-01-12: 5 mg via INTRAVENOUS

## 2014-01-12 MED ORDER — LEVOTHYROXINE SODIUM 50 MCG PO TABS
50.0000 ug | ORAL_TABLET | Freq: Every day | ORAL | Status: DC
  Administered 2014-01-13 – 2014-01-14 (×2): 50 ug via ORAL
  Filled 2014-01-12 (×4): qty 1

## 2014-01-12 MED ORDER — ONDANSETRON HCL 4 MG/2ML IJ SOLN
INTRAMUSCULAR | Status: DC | PRN
  Administered 2014-01-12: 4 mg via INTRAVENOUS

## 2014-01-12 MED ORDER — PROPOFOL 10 MG/ML IV BOLUS
INTRAVENOUS | Status: AC
  Filled 2014-01-12: qty 20

## 2014-01-12 MED ORDER — MAGNESIUM CITRATE PO SOLN: 1.0000 | Freq: Once | ORAL | Status: AC | PRN

## 2014-01-12 MED ORDER — DIAZEPAM 5 MG PO TABS: 5.0000 mg | ORAL_TABLET | Freq: Three times a day (TID) | ORAL | Status: DC | PRN

## 2014-01-12 MED ORDER — TRANEXAMIC ACID 100 MG/ML IV SOLN
1000.0000 mg | INTRAVENOUS | Status: DC | PRN
  Administered 2014-01-12: 1000 mg via INTRAVENOUS

## 2014-01-12 MED ORDER — SODIUM CHLORIDE 0.9 % IV SOLN
1000.0000 mg | INTRAVENOUS | Status: DC | PRN
  Administered 2014-01-12: 1000 mg via INTRAVENOUS

## 2014-01-12 MED ORDER — AMPHETAMINE-DEXTROAMPHETAMINE 10 MG PO TABS: 20.0000 mg | ORAL_TABLET | Freq: Every day | ORAL | Status: DC

## 2014-01-12 MED ORDER — PROPOFOL INFUSION 10 MG/ML OPTIME
INTRAVENOUS | Status: DC | PRN
  Administered 2014-01-12: 120 ug/kg/min via INTRAVENOUS

## 2014-01-12 MED ORDER — METOCLOPRAMIDE HCL 5 MG/ML IJ SOLN
5.0000 mg | Freq: Three times a day (TID) | INTRAMUSCULAR | Status: DC | PRN
  Administered 2014-01-13: 10 mg via INTRAVENOUS
  Filled 2014-01-12: qty 2

## 2014-01-12 MED ORDER — FENTANYL CITRATE 0.05 MG/ML IJ SOLN
INTRAMUSCULAR | Status: DC | PRN
  Administered 2014-01-12 (×2): 50 ug via INTRAVENOUS

## 2014-01-12 MED ORDER — ESTRADIOL 2 MG PO TABS
2.0000 mg | ORAL_TABLET | Freq: Every day | ORAL | Status: DC
  Administered 2014-01-13 – 2014-01-14 (×2): 2 mg via ORAL
  Filled 2014-01-12 (×3): qty 1

## 2014-01-12 MED ORDER — GABAPENTIN 300 MG PO CAPS
300.0000 mg | ORAL_CAPSULE | Freq: Every morning | ORAL | Status: DC
  Administered 2014-01-13 – 2014-01-14 (×2): 300 mg via ORAL
  Filled 2014-01-12 (×2): qty 1

## 2014-01-12 MED ORDER — HYDROMORPHONE HCL PF 1 MG/ML IJ SOLN: 0.2500 mg | INTRAMUSCULAR | Status: DC | PRN

## 2014-01-12 MED ORDER — METHOCARBAMOL 1000 MG/10ML IJ SOLN
500.0000 mg | Freq: Four times a day (QID) | INTRAVENOUS | Status: DC | PRN
  Administered 2014-01-12: 500 mg via INTRAVENOUS
  Filled 2014-01-12: qty 5

## 2014-01-12 MED ORDER — ALUM & MAG HYDROXIDE-SIMETH 200-200-20 MG/5ML PO SUSP: 30.0000 mL | ORAL | Status: DC | PRN

## 2014-01-12 MED ORDER — METOCLOPRAMIDE HCL 10 MG PO TABS: 5.0000 mg | ORAL_TABLET | Freq: Three times a day (TID) | ORAL | Status: DC | PRN

## 2014-01-12 MED ORDER — FERROUS SULFATE 325 (65 FE) MG PO TABS
325.0000 mg | ORAL_TABLET | Freq: Three times a day (TID) | ORAL | Status: DC
  Administered 2014-01-13 – 2014-01-14 (×3): 325 mg via ORAL
  Filled 2014-01-12 (×7): qty 1

## 2014-01-12 MED ORDER — BISACODYL 10 MG RE SUPP: 10.0000 mg | Freq: Every day | RECTAL | Status: DC | PRN

## 2014-01-12 MED ORDER — METOPROLOL SUCCINATE ER 50 MG PO TB24
50.0000 mg | ORAL_TABLET | Freq: Every day | ORAL | Status: DC
  Administered 2014-01-12: 50 mg via ORAL
  Filled 2014-01-12 (×3): qty 1

## 2014-01-12 MED ORDER — FENTANYL CITRATE 0.05 MG/ML IJ SOLN
INTRAMUSCULAR | Status: AC
  Filled 2014-01-12: qty 2

## 2014-01-12 MED ORDER — POLYETHYLENE GLYCOL 3350 17 G PO PACK
17.0000 g | PACK | Freq: Two times a day (BID) | ORAL | Status: DC
  Administered 2014-01-13 – 2014-01-14 (×2): 17 g via ORAL

## 2014-01-12 MED ORDER — DEXAMETHASONE SODIUM PHOSPHATE 10 MG/ML IJ SOLN
INTRAMUSCULAR | Status: DC | PRN
  Administered 2014-01-12: 10 mg via INTRAVENOUS

## 2014-01-12 MED ORDER — GABAPENTIN 300 MG PO CAPS
600.0000 mg | ORAL_CAPSULE | Freq: Every day | ORAL | Status: DC
  Administered 2014-01-12 – 2014-01-13 (×2): 600 mg via ORAL
  Filled 2014-01-12 (×3): qty 2

## 2014-01-12 MED ORDER — PHENOL 1.4 % MT LIQD
1.0000 | OROMUCOSAL | Status: DC | PRN
  Filled 2014-01-12: qty 177

## 2014-01-12 MED ORDER — CELECOXIB 200 MG PO CAPS
200.0000 mg | ORAL_CAPSULE | Freq: Two times a day (BID) | ORAL | Status: DC
  Administered 2014-01-12 – 2014-01-14 (×4): 200 mg via ORAL
  Filled 2014-01-12 (×5): qty 1

## 2014-01-12 MED ORDER — AMPHETAMINE-DEXTROAMPHETAMINE 10 MG PO TABS
40.0000 mg | ORAL_TABLET | Freq: Every day | ORAL | Status: DC
  Administered 2014-01-13 – 2014-01-14 (×2): 40 mg via ORAL
  Filled 2014-01-12 (×2): qty 4

## 2014-01-12 MED ORDER — MIDAZOLAM HCL 5 MG/5ML IJ SOLN
INTRAMUSCULAR | Status: DC | PRN
  Administered 2014-01-12 (×2): 1 mg via INTRAVENOUS

## 2014-01-12 MED ORDER — ONDANSETRON HCL 4 MG/2ML IJ SOLN: 4.0000 mg | Freq: Four times a day (QID) | INTRAMUSCULAR | Status: DC | PRN

## 2014-01-12 MED ORDER — MONTELUKAST SODIUM 10 MG PO TABS
10.0000 mg | ORAL_TABLET | Freq: Every morning | ORAL | Status: DC
  Administered 2014-01-13 – 2014-01-14 (×2): 10 mg via ORAL
  Filled 2014-01-12 (×2): qty 1

## 2014-01-12 MED ORDER — CEFAZOLIN SODIUM-DEXTROSE 2-3 GM-% IV SOLR
2.0000 g | Freq: Four times a day (QID) | INTRAVENOUS | Status: AC
  Administered 2014-01-12 – 2014-01-13 (×2): 2 g via INTRAVENOUS
  Filled 2014-01-12 (×2): qty 50

## 2014-01-12 MED ORDER — HYDROCODONE-ACETAMINOPHEN 7.5-325 MG PO TABS
1.0000 | ORAL_TABLET | ORAL | Status: DC
Start: 2014-01-12 — End: 2014-01-14
  Administered 2014-01-12: 2 via ORAL
  Administered 2014-01-13 (×2): 1 via ORAL
  Administered 2014-01-13: 2 via ORAL
  Administered 2014-01-14: 1 via ORAL
  Filled 2014-01-12 (×2): qty 2
  Filled 2014-01-12: qty 1
  Filled 2014-01-12: qty 2
  Filled 2014-01-12 (×3): qty 1

## 2014-01-12 MED ORDER — METHOCARBAMOL 500 MG PO TABS
500.0000 mg | ORAL_TABLET | Freq: Four times a day (QID) | ORAL | Status: DC | PRN
  Administered 2014-01-13 – 2014-01-14 (×2): 500 mg via ORAL
  Filled 2014-01-12 (×4): qty 1

## 2014-01-12 MED ORDER — LORATADINE 10 MG PO TABS
10.0000 mg | ORAL_TABLET | Freq: Every day | ORAL | Status: DC
  Administered 2014-01-13 – 2014-01-14 (×2): 10 mg via ORAL
  Filled 2014-01-12 (×2): qty 1

## 2014-01-12 MED ORDER — DEXAMETHASONE SODIUM PHOSPHATE 10 MG/ML IJ SOLN: 10.0000 mg | Freq: Once | INTRAMUSCULAR | Status: DC

## 2014-01-12 MED ORDER — LACTATED RINGERS IV SOLN
INTRAVENOUS | Status: DC
  Administered 2014-01-12: 16:00:00 via INTRAVENOUS
  Administered 2014-01-12: 1000 mL via INTRAVENOUS

## 2014-01-12 MED ORDER — PANTOPRAZOLE SODIUM 40 MG PO TBEC
40.0000 mg | DELAYED_RELEASE_TABLET | Freq: Every day | ORAL | Status: DC
  Administered 2014-01-12 – 2014-01-13 (×2): 40 mg via ORAL
  Filled 2014-01-12 (×3): qty 1

## 2014-01-12 MED ORDER — SUMATRIPTAN SUCCINATE 100 MG PO TABS
100.0000 mg | ORAL_TABLET | ORAL | Status: DC | PRN
  Filled 2014-01-12: qty 1

## 2014-01-12 SURGICAL SUPPLY — 40 items
ADH SKN CLS APL DERMABOND .7 (GAUZE/BANDAGES/DRESSINGS) ×1
BAG SPEC THK2 15X12 ZIP CLS (MISCELLANEOUS) ×1
BAG ZIPLOCK 12X15 (MISCELLANEOUS) ×1 IMPLANT
CAPT HIP PF COP ×1 IMPLANT
COVER PERINEAL POST (MISCELLANEOUS) ×2 IMPLANT
DERMABOND ADVANCED (GAUZE/BANDAGES/DRESSINGS) ×1
DERMABOND ADVANCED .7 DNX12 (GAUZE/BANDAGES/DRESSINGS) ×1 IMPLANT
DRAPE C-ARM 42X120 X-RAY (DRAPES) ×2 IMPLANT
DRAPE STERI IOBAN 125X83 (DRAPES) ×2 IMPLANT
DRAPE U-SHAPE 47X51 STRL (DRAPES) ×6 IMPLANT
DRSG AQUACEL AG ADV 3.5X10 (GAUZE/BANDAGES/DRESSINGS) ×2 IMPLANT
DURAPREP 26ML APPLICATOR (WOUND CARE) ×2 IMPLANT
ELECT BLADE TIP CTD 4 INCH (ELECTRODE) ×2 IMPLANT
ELECT PENCIL ROCKER SW 15FT (MISCELLANEOUS) ×1 IMPLANT
ELECT REM PT RETURN 15FT ADLT (MISCELLANEOUS) IMPLANT
ELECT REM PT RETURN 9FT ADLT (ELECTROSURGICAL) ×2
ELECTRODE REM PT RTRN 9FT ADLT (ELECTROSURGICAL) ×1 IMPLANT
FACESHIELD WRAPAROUND (MASK) ×8 IMPLANT
FACESHIELD WRAPAROUND OR TEAM (MASK) ×4 IMPLANT
GLOVE BIOGEL PI IND STRL 7.5 (GLOVE) ×1 IMPLANT
GLOVE BIOGEL PI IND STRL 8 (GLOVE) ×1 IMPLANT
GLOVE BIOGEL PI INDICATOR 7.5 (GLOVE) ×1
GLOVE BIOGEL PI INDICATOR 8 (GLOVE) ×1
GLOVE ECLIPSE 8.0 STRL XLNG CF (GLOVE) ×2 IMPLANT
GLOVE ORTHO TXT STRL SZ7.5 (GLOVE) ×4 IMPLANT
GOWN SPEC L3 XXLG W/TWL (GOWN DISPOSABLE) ×2 IMPLANT
GOWN STRL REUS W/TWL LRG LVL3 (GOWN DISPOSABLE) ×2 IMPLANT
HOLDER FOLEY CATH W/STRAP (MISCELLANEOUS) ×2 IMPLANT
KIT BASIN OR (CUSTOM PROCEDURE TRAY) ×2 IMPLANT
PACK TOTAL JOINT (CUSTOM PROCEDURE TRAY) ×2 IMPLANT
SAW OSC TIP CART 19.5X105X1.3 (SAW) ×2 IMPLANT
SUT MNCRL AB 4-0 PS2 18 (SUTURE) ×2 IMPLANT
SUT VIC AB 1 CT1 36 (SUTURE) ×7 IMPLANT
SUT VIC AB 2-0 CT1 27 (SUTURE) ×4
SUT VIC AB 2-0 CT1 TAPERPNT 27 (SUTURE) ×2 IMPLANT
SUT VLOC 180 0 24IN GS25 (SUTURE) ×2 IMPLANT
TOWEL OR 17X26 10 PK STRL BLUE (TOWEL DISPOSABLE) ×2 IMPLANT
TOWEL OR NON WOVEN STRL DISP B (DISPOSABLE) ×1 IMPLANT
TRAY FOLEY CATH 14FRSI W/METER (CATHETERS) ×2 IMPLANT
WATER STERILE IRR 1500ML POUR (IV SOLUTION) ×2 IMPLANT

## 2014-01-12 NOTE — Op Note (Signed)
NAME:  Stephanie Cobb                ACCOUNT NO.: [id number]      MEDICAL RECORD NO.: [id number]      FACILITY:  [facility]      PHYSICIAN:  Durene Romans D  DATE OF BIRTH:  [birth year]     DATE OF PROCEDURE:  01/12/2014                                 OPERATIVE REPORT         PREOPERATIVE DIAGNOSIS: Left  hip osteoarthritis.      POSTOPERATIVE DIAGNOSIS:  Left hip osteoarthritis.      PROCEDURE:  Left total hip replacement through an anterior approach   utilizing DePuy THR system, component size 52mm pinnacle cup, a size 36+4 neutral   Altrex liner, a size 3 Hi Tri Lock stem with a 36+1.5 delta ceramic   ball.      SURGEON:  Madlyn Frankel. Charlann Boxer, M.D.      ASSISTANT:  Skip Mayer, PA-C     ANESTHESIA:  Spinal.      SPECIMENS:  None.      COMPLICATIONS:  None.      BLOOD LOSS:  350cc     DRAINS:  None.      INDICATION OF THE PROCEDURE:  Stephanie Cobb is a 53 y.o. female who had   presented to office for evaluation of left hip pain.  Radiographs revealed   progressive degenerative changes with bone-on-bone   articulation to the  hip joint.  The patient had painful limited range of   motion significantly affecting their overall quality of life.  The patient was failing to    respond to conservative measures, and at this point was ready   to proceed with more definitive measures.  The patient has noted progressive   degenerative changes in his hip, progressive problems and dysfunction   with regarding the hip prior to surgery.  Consent was obtained for   benefit of pain relief.  Specific risk of infection, DVT, component   failure, dislocation, need for revision surgery, as well discussion of   the anterior versus posterior approach were reviewed.  Consent was   obtained for benefit of anterior pain relief through an anterior   approach.      PROCEDURE IN DETAIL:  The patient was brought to operative theater.   Once adequate anesthesia, preoperative  antibiotics, 2gm of Ancef and 1gm of Vancomycin administered.   The patient was positioned supine on the OSI Hanna table.  Once adequate   padding of boney process was carried out, we had predraped out the hip, and  used fluoroscopy to confirm orientation of the pelvis and position.      The left hip was then prepped and draped from proximal iliac crest to   mid thigh with shower curtain technique.      Time-out was performed identifying the patient, planned procedure, and   extremity.     An incision was then made 2 cm distal and lateral to the   anterior superior iliac spine extending over the orientation of the   tensor fascia lata muscle and sharp dissection was carried down to the   fascia of the muscle and protractor placed in the soft tissues.      The fascia was then incised.  The muscle belly was  identified and swept   laterally and retractor placed along the superior neck.  Following   cauterization of the circumflex vessels and removing some pericapsular   fat, a second cobra retractor was placed on the inferior neck.  A third   retractor was placed on the anterior acetabulum after elevating the   anterior rectus.  A L-capsulotomy was along the line of the   superior neck to the trochanteric fossa, then extended proximally and   distally.  Tag sutures were placed and the retractors were then placed   intracapsular.  We then identified the trochanteric fossa and   orientation of my neck cut, confirmed this radiographically   and then made a neck osteotomy with the femur on traction.  The femoral   head was removed without difficulty or complication.  Traction was let   off and retractors were placed posterior and anterior around the   acetabulum.      The labrum and foveal tissue were debrided.  I began reaming with a 47mm   reamer and reamed up to 51mm reamer with good bony bed preparation and a 52mm   cup was chosen.  The final 52mm Pinnacle cup was then impacted under  fluoroscopy  to confirm the depth of penetration and orientation with respect to   abduction.  A screw was placed followed by the hole eliminator.  The final   36+4 neutral Altrex liner was impacted with good visualized rim fit.  The cup was positioned anatomically within the acetabular portion of the pelvis.      At this point, the femur was rolled at 80 degrees.  Further capsule was   released off the inferior aspect of the femoral neck.  I then   released the superior capsule proximally.  The hook was placed laterally   along the femur and elevated manually and held in position with the bed   hook.  The leg was then extended and adducted with the leg rolled to 100   degrees of external rotation.  Once the proximal femur was fully   exposed, I used a box osteotome to set orientation.  I then began   broaching with the starting chili pepper broach and passed this by hand and then broached up to 3.  With the 3 broach in place I chose a high offset neck and did a trial reductions.  She had end stage OA of the right hip scheduled for this September. We will match leg lengths based off this left hip upon return (reviewed this with her husband) The offset was appropriate, leg lengths   appeared to be equal, confirmed radiographically.   Given these findings, I went ahead and dislocated the hip, repositioned all   retractors and positioned the right hip in the extended and abducted position.  The final 3 HI Tri Lock stem was   chosen and it was impacted down to the level of neck cut.  Based on this   and the trial reduction, a 36+1.5 delta ceramic ball was chosen and   impacted onto a clean and dry trunnion, and the hip was reduced.  The   hip had been irrigated throughout the case again at this point.  I did   reapproximate the superior capsular leaflet to the anterior leaflet   using #1 Vicryl.  The fascia of the   tensor fascia lata muscle was then reapproximated using #1 Vicryl.  The    remaining wound was closed with 2-0 Vicryl  and running 4-0 Monocryl.   The hip was cleaned, dried, and dressed sterilely using Dermabond and   Aquacel dressing.  She was then brought   to recovery room in stable condition tolerating the procedure well.    Skip MayerBlair Roberts, PA-C was present for the entirety of the case involved from   preoperative positioning, perioperative retractor management, general   facilitation of the case, as well as primary wound closure as assistant.            Madlyn FrankelMatthew D. Charlann Boxerlin, M.D.        01/12/2014 5:12 PM

## 2014-01-12 NOTE — H&P (View-Only) (Signed)
TOTAL HIP ADMISSION H&P  Patient is admitted for left total hip arthroplasty, anterior approach.  Subjective:  Chief Complaint:    Left hip OA / pain  HPI: Stephanie Cobb, 53 y.o. female, has a history of pain and functional disability in the left hip(s) due to arthritis and patient has failed non-surgical conservative treatments for greater than 12 weeks to include NSAID's and/or analgesics, use of assistive devices and activity modification.  Onset of symptoms was gradual starting 9+ years ago with gradually worsening course since that time.The patient noted no past surgery on the bilateral hip(s).  Patient currently rates pain in the left hip at 10 out of 10 with activity. Patient has night pain, worsening of pain with activity and weight bearing, trendelenberg gait, pain that interfers with activities of daily living and pain with passive range of motion. Patient has evidence of periarticular osteophytes and joint space narrowing by imaging studies. This condition presents safety issues increasing the risk of falls.  There is no current active infection.  Risks, benefits and expectations were discussed with the patient.  Risks including but not limited to the risk of anesthesia, blood clots, nerve damage, blood vessel damage, failure of the prosthesis, infection and up to and including death.  Patient understand the risks, benefits and expectations and wishes to proceed with surgery.   PCP: MOON,AMY, NP  D/C Plans:      Home with HHPT  Post-op Meds:       No Rx given  Tranexamic Acid:      To be given - IV    Decadron:      Is to be given  FYI:     Vancomycin antibiotic (Previous bad MRSA infection)  ASA post-op  Norco post-op    Past Medical History  Diagnosis Date  . Hypertension   . Heart palpitations     [facility] NO ISCHEMIA, EF 74%  . Hypothyroidism   . Arthritis     OA / PAIN BOTH HIPS  . Pain     CHRONIC NECK PAIN - DDD  CERVICOTHORACIC; HX OF CERVICA FUSION - PT STATES 2 AREAS HAVE NOT FUSED AND SHE WILL NEED FURTHER SURGERY IN THE FUTURE  . Fibromyalgia   . Headache(784.0)     MIGRAINES  . Hypertriglyceridemia   . ADD (attention deficit disorder)   . GERD (gastroesophageal reflux disease)   . Insomnia   . PONV (postoperative nausea and vomiting)   . Pneumonia     ABOUT 6 YRS AGO  . Depression   . Anxiety   . Kidney insufficiency     PT STATES BUN SLIGHTLY ELEVATED - HER DOCTORS ARE WATCHING  . Scoliosis     Past Surgical History  Procedure Laterality Date  . Tubal ligation    . Abdominal hysterectomy    . Cervical fusion  2011    No prescriptions prior to admission   No Known Allergies   History  Substance Use Topics  . Smoking status: Former Smoker -- 1.00 packs/day for 10 years    Types: Cigarettes  . Smokeless tobacco: Never Used  . Alcohol Use: No     Comment: QUIT SMOKING  ABOUT 6 YRS AGO       Review of Systems  Constitutional: Negative.   Eyes: Negative.   Respiratory: Negative.   Cardiovascular: Negative.   Gastrointestinal: Positive for heartburn.  Genitourinary: Negative.   Musculoskeletal: Positive for joint pain and neck pain.  Skin:  Negative.   Neurological: Positive for headaches.  Endo/Heme/Allergies: Negative.   Psychiatric/Behavioral: Positive for depression. The patient is nervous/anxious.     Objective:  Physical Exam  Constitutional: She is oriented to person, place, and time. She appears well-developed and well-nourished.  HENT:  Head: Normocephalic and atraumatic.  Mouth/Throat: Oropharynx is clear and moist.  Eyes: Pupils are equal, round, and reactive to light.  Neck: Neck supple. No JVD present. No tracheal deviation present. No thyromegaly present.  Cardiovascular: Normal rate, regular rhythm, normal heart sounds and intact distal pulses.   Respiratory: Effort normal and breath sounds normal. No stridor. No respiratory distress. She has no  wheezes.  GI: Soft. There is no tenderness. There is no guarding.  Musculoskeletal:       Left hip: She exhibits decreased range of motion, decreased strength, tenderness and bony tenderness. She exhibits no swelling, no deformity and no laceration.  Lymphadenopathy:    She has no cervical adenopathy.  Neurological: She is alert and oriented to person, place, and time.  Skin: Skin is warm and dry.  Psychiatric: She has a normal mood and affect.    Vital signs in last 24 hours: Temp:  [98.1 F (36.7 C)] 98.1 F (36.7 C) (07/29 0730) Pulse Rate:  [59] 59 (07/29 0730) Resp:  [16] 16 (07/29 0730) BP: (159)/(91) 159/91 mmHg (07/29 0730) SpO2:  [100 %] 100 % (07/29 0730) Weight:  [75.297 kg (166 lb)] 75.297 kg (166 lb) (07/29 0730)    Imaging Review Plain radiographs demonstrate severe degenerative joint disease of the bilateral hip(s). The bone quality appears to be good for age and reported activity level.  Assessment/Plan:  End stage arthritis, left hip(s)  The patient history, physical examination, clinical judgement of the provider and imaging studies are consistent with end stage degenerative joint disease of the left hip(s) and total hip arthroplasty is deemed medically necessary. The treatment options including medical management, injection therapy, arthroscopy and arthroplasty were discussed at length. The risks and benefits of total hip arthroplasty were presented and reviewed. The risks due to aseptic loosening, infection, stiffness, dislocation/subluxation,  thromboembolic complications and other imponderables were discussed.  The patient acknowledged the explanation, agreed to proceed with the plan and consent was signed. Patient is being admitted for inpatient treatment for surgery, pain control, PT, OT, prophylactic antibiotics, VTE prophylaxis, progressive ambulation and ADL's and discharge planning.The patient is planning to be discharged home with home health  services.    [name] Carly Applegate   PA-C  12/30/2013, 6:01 PM

## 2014-01-12 NOTE — Anesthesia Postprocedure Evaluation (Signed)
  Anesthesia Post-op Note  Patient: Philbert RiserPaula P Paladino  Procedure(s) Performed: Procedure(s): LEFT TOTAL HIP ARTHROPLASTY ANTERIOR APPROACH (Left)  Patient Location: PACU  Anesthesia Type:Spinal  Level of Consciousness: awake, alert  and oriented  Airway and Oxygen Therapy: Patient Spontanous Breathing  Post-op Pain: none  Post-op Assessment: Post-op Vital signs reviewed  Post-op Vital Signs: Reviewed  Last Vitals:  Filed Vitals:   01/12/14 1830  BP: 133/77  Pulse: 67  Temp: 36.6 C  Resp: 14    Complications: No apparent anesthesia complications

## 2014-01-12 NOTE — Transfer of Care (Signed)
Immediate Anesthesia Transfer of Care Note  Patient: Stephanie Cobb  Procedure(s) Performed: Procedure(s): LEFT TOTAL HIP ARTHROPLASTY ANTERIOR APPROACH (Left)  Patient Location: PACU  Anesthesia Type:Spinal  Level of Consciousness: awake, oriented, patient cooperative, lethargic and responds to stimulation  Airway & Oxygen Therapy: Patient Spontanous Breathing and Patient connected to face mask oxygen  Post-op Assessment: Report given to PACU RN and Post -op Vital signs reviewed and stable  Post vital signs: Reviewed and stable  Complications: No apparent anesthesia complications

## 2014-01-12 NOTE — Interval H&P Note (Signed)
History and Physical Interval Note:  01/12/2014 1:24 PM  [name]  has presented today for surgery, with the diagnosis of left hip osteoathritis  The various methods of treatment have been discussed with the patient and family. After consideration of risks, benefits and other options for treatment, the patient has consented to  Procedure(s): LEFT TOTAL HIP ARTHROPLASTY ANTERIOR APPROACH (Left) as a surgical intervention .  The patient's history has been reviewed, patient examined, no change in status, stable for surgery.  I have reviewed the patient's chart and labs.  Questions were answered to the patient's satisfaction.     Shelda PalLIN,Osa Campoli D

## 2014-01-12 NOTE — Anesthesia Procedure Notes (Signed)
Spinal  Patient location during procedure: OR Start time: 01/12/2014 3:15 PM Staffing Anesthesiologist: Marcene DuosFITZGERALD, Zennie Ayars E Performed by: anesthesiologist  Preanesthetic Checklist Completed: patient identified, site marked, surgical consent, pre-op evaluation, timeout performed, IV checked, risks and benefits discussed and monitors and equipment checked Spinal Block Patient position: sitting Prep: Betasept and site prepped and draped Patient monitoring: heart rate, cardiac monitor, continuous pulse ox and blood pressure Approach: midline Location: L3-4 Injection technique: single-shot Needle Needle type: Spinocan  Needle gauge: 22 G Needle length: 9 cm Additional Notes Prepped and draped in sterile fashion. Aspiration CSF obtained before and after injection of LA. Total of 3cc's 0.5% bupivicaine plain injected intrathecally.

## 2014-01-12 NOTE — Anesthesia Preprocedure Evaluation (Addendum)
Anesthesia Evaluation  Patient identified by MRN, date of birth, ID band Patient awake    Reviewed: Allergy & Precautions, H&P , NPO status , Patient's Chart, lab work & pertinent test results  History of Anesthesia Complications (+) PONV and history of anesthetic complications  Airway Mallampati: I TM Distance: >3 FB Neck ROM: Full   Comment: Pt with "loose screws" in C-spine from previous fusion. Not painful per pt and normal ROM. Dental  (+) Teeth Intact, Dental Advisory Given   Pulmonary former smoker,  breath sounds clear to auscultation        Cardiovascular hypertension, Rhythm:Regular Rate:Normal     Neuro/Psych  Headaches, PSYCHIATRIC DISORDERS Anxiety Depression Previous cervical spine fusion with two "loose screws" per pt. Normal ROM and non-painful.    GI/Hepatic Neg liver ROS, GERD-  ,  Endo/Other  Hypothyroidism   Renal/GU CRFRenal disease  negative genitourinary   Musculoskeletal  (+) Fibromyalgia -  Abdominal   Peds  Hematology negative hematology ROS (+)   Anesthesia Other Findings   Reproductive/Obstetrics negative OB ROS                          Anesthesia Physical Anesthesia Plan  ASA: II  Anesthesia Plan: Spinal   Post-op Pain Management:    Induction: Intravenous  Airway Management Planned: Simple Face Mask  Additional Equipment:   Intra-op Plan:   Post-operative Plan:   Informed Consent: I have reviewed the patients History and Physical, chart, labs and discussed the procedure including the risks, benefits and alternatives for the proposed anesthesia with the patient or authorized representative who has indicated his/her understanding and acceptance.   Dental advisory given  Plan Discussed with: CRNA, Anesthesiologist and Surgeon  Anesthesia Plan Comments:         Anesthesia Quick Evaluation

## 2014-01-13 DIAGNOSIS — D62 Acute posthemorrhagic anemia: Secondary | ICD-10-CM | POA: Diagnosis not present

## 2014-01-13 DIAGNOSIS — E663 Overweight: Secondary | ICD-10-CM | POA: Diagnosis present

## 2014-01-13 HISTORY — DX: Overweight: E66.3

## 2014-01-13 LAB — BASIC METABOLIC PANEL
Anion gap: 9 (ref 5–15)
BUN: 13 mg/dL (ref 6–23)
CO2: 27 mEq/L (ref 19–32)
Calcium: 8.4 mg/dL (ref 8.4–10.5)
Chloride: 101 mEq/L (ref 96–112)
Creatinine, Ser: 0.92 mg/dL (ref 0.50–1.10)
GFR calc Af Amer: 80 mL/min — ABNORMAL LOW (ref >90)
GFR calc non Af Amer: 69 mL/min — ABNORMAL LOW (ref >90)
GLUCOSE: 151 mg/dL — AB (ref 70–99)
POTASSIUM: 5.5 meq/L — AB (ref 3.7–5.3)
Sodium: 137 mEq/L (ref 137–147)

## 2014-01-13 LAB — CBC
HEMATOCRIT: 31.2 % — AB (ref 36.0–46.0)
HEMOGLOBIN: 10.5 g/dL — AB (ref 12.0–15.0)
MCH: 31.4 pg (ref 26.0–34.0)
MCHC: 33.7 g/dL (ref 30.0–36.0)
MCV: 93.4 fL (ref 78.0–100.0)
Platelets: 306 10*3/uL (ref 150–400)
RBC: 3.34 MIL/uL — ABNORMAL LOW (ref 3.87–5.11)
RDW: 11.9 % (ref 11.5–15.5)
WBC: 9.1 10*3/uL (ref 4.0–10.5)

## 2014-01-13 MED ORDER — SODIUM CHLORIDE 0.9 % IV SOLN
INTRAVENOUS | Status: DC
  Administered 2014-01-13: 18:00:00 via INTRAVENOUS

## 2014-01-13 MED ORDER — SODIUM CHLORIDE 0.9 % IV SOLN
INTRAVENOUS | Status: DC
  Administered 2014-01-13: 08:00:00 via INTRAVENOUS

## 2014-01-13 NOTE — Progress Notes (Signed)
Physical Therapy Treatment Patient Details Name: Stephanie Cobb MRN: [id number] DOB: May 06, 1961 Today's Date: 01/13/2014    History of Present Illness s/p L DA THA    PT Comments    Progressing with ambulation but continues with intermittent instability and impulsive movements  Follow Up Recommendations  Home health PT     Equipment Recommendations  Rolling walker with 5" wheels    Recommendations for Other Services OT consult     Precautions / Restrictions Precautions Precautions: Fall Restrictions Weight Bearing Restrictions: No Other Position/Activity Restrictions: WBAT    Mobility  Bed Mobility                  Transfers Overall transfer level: Needs assistance Equipment used: Rolling walker (2 wheeled) Transfers: Sit to/from Stand Sit to Stand: Supervision         General transfer comment: cues for UE/LE placement  Ambulation/Gait Ambulation/Gait assistance: Min assist Ambulation Distance (Feet): 170 Feet Assistive device: Rolling walker (2 wheeled) Gait Pattern/deviations: Step-to pattern;Step-through pattern;Decreased step length - right;Decreased step length - left;Shuffle;Trunk flexed     General Gait Details: cues for sequence, posture, position from RW and ER on L   Stairs            Wheelchair Mobility    Modified Rankin (Stroke Patients Only)       Balance                                    Cognition Arousal/Alertness: Awake/alert Behavior During Therapy: WFL for tasks assessed/performed Overall Cognitive Status: Within Functional Limits for tasks assessed                      Exercises      General Comments        Pertinent Vitals/Pain Pain Assessment: 0-10 Pain Score: 2  Pain Location: L hip Pain Descriptors / Indicators: Sore Pain Intervention(s): Limited activity within patient's tolerance;Monitored during session;Premedicated before session;Ice applied    Home Living                       Prior Function            PT Goals (current goals can now be found in the care plan section) Acute Rehab PT Goals Patient Stated Goal: get healed and have other hip replaced PT Goal Formulation: With patient Time For Goal Achievement: 01/21/14 Potential to Achieve Goals: Good Progress towards PT goals: Progressing toward goals    Frequency  7X/week    PT Plan Current plan remains appropriate    Co-evaluation             End of Session Equipment Utilized During Treatment: Gait belt Activity Tolerance: Patient tolerated treatment well Patient left: in chair;with call bell/phone within reach;with family/visitor present     Time: [id number] PT Time Calculation (min): 17 min  Charges:  $Gait Training: 8-22 mins                    G Codes:      Stephanie Cobb 01/13/2014, 3:34 PM

## 2014-01-13 NOTE — Progress Notes (Signed)
CARE MANAGEMENT NOTE 01/13/2014  Patient:  Stephanie Cobb,Stephanie Cobb   Account Number:  [id number]  Date Initiated:  01/13/2014  Documentation initiated by:  DAVIS,RHONDA  Subjective/Objective Assessment:   Left total hip replacement through an anterior approach     Action/Plan:   home has all required dme from previous surgeries.   Anticipated DC Date:  01/15/2014   Anticipated DC Plan:  HOME W HOME HEALTH SERVICES  In-house referral  NA      DC Planning Services  CM consult      [facility]  [facility]   Choice offered to / List presented to:  C-1 Patient   DME arranged  NA      DME agency  NA     HH arranged  HH-2 PT      [facility]  [facility]   Status of service:  In process, will continue to follow Medicare Important Message given?  NA - LOS <3 / Initial given by admissions (If response is "NO", the following Medicare IM given date fields will be blank) Date Medicare IM given:   Medicare IM given by:   Date Additional Medicare IM given:   Additional Medicare IM given by:    Discharge Disposition:    Per UR Regulation:  Reviewed for med. necessity/level of care/duration of stay  If discussed at Long Length of Stay Meetings, dates discussed:    Comments:  [id number]/Rhonda Lorrin MaisDavis, RN,BSN,CCM: [phone] Chart reviewed for patient status and needs. No discharge needs present at time of review. Next review due on [id number].

## 2014-01-13 NOTE — Progress Notes (Signed)
[facility]: RW and Commode (patient also inquired about a tub transfer bench.  I will give her the price of one and wait for her decision.)  If patient discharges after hours, please call [phone].   Renard HamperLecretia Williamson 01/13/2014, 10:10 AM

## 2014-01-13 NOTE — Evaluation (Signed)
Occupational Therapy Evaluation Patient Details Name: Stephanie Cobb MRN: [id number] DOB: 07-30-1960 Today's Date: 01/13/2014    History of Present Illness s/p L DA THA   Clinical Impression   This 53 year old female was admitted for the above surgery. She will benefit from skilled OT in acute to continue education for toilet and tub transfers.  Pt has AE kit and husband will assist with adls as needed at home.  Pt also has fibromyalgia and this pain interferes with adls.  Goals are set for supervision to min A    Follow Up Recommendations  No OT follow up    Equipment Recommendations  3 in 1 bedside comode (pt wants price on tub transfer bench)    Recommendations for Other Services       Precautions / Restrictions Precautions Precautions: Fall Restrictions Weight Bearing Restrictions: No      Mobility Bed Mobility                  Transfers Overall transfer level: Needs assistance Equipment used: Rolling walker (2 wheeled) Transfers: Sit to/from Stand Sit to Stand: Supervision         General transfer comment: cues for UE/LE placement    Balance                                            ADL Overall ADL's : Needs assistance/impaired     Grooming: Supervision/safety;Standing   Upper Body Bathing: Set up;Sitting   Lower Body Bathing: Minimal assistance;With adaptive equipment;Sit to/from stand   Upper Body Dressing : Set up;Sitting   Lower Body Dressing: Moderate assistance;Sit to/from stand   Toilet Transfer: Min guard;Ambulation;BSC   Toileting- Water quality scientist and Hygiene: Min guard;Sit to/from stand         General ADL Comments: pt is interested in a tub bench:  demonstrated use/positioning but pt did not want to practice today:  will try tomorrow.  Pt did ambulate to bathroom and practiced transfer on 3:1 over commode.  Pt bought AE kit--educated on use and pt practiced with sock aide.  She may have husband help  her instead of using the kit. Pt had some nausea during session but was able to work through it.     Vision                     Perception     Praxis      Pertinent Vitals/Pain Pain Assessment: 0-10 Pain Score: 1  Pain Location: L hip Pain Descriptors / Indicators: Sore Pain Intervention(s): Repositioned;Ice applied     Hand Dominance     Extremity/Trunk Assessment Upper Extremity Assessment Upper Extremity Assessment: Overall WFL for tasks assessed           Communication Communication Communication: No difficulties   Cognition Arousal/Alertness: Awake/alert Behavior During Therapy: WFL for tasks assessed/performed Overall Cognitive Status: Within Functional Limits for tasks assessed                     General Comments       Exercises       Shoulder Instructions      Home Living Family/patient expects to be discharged to:: Private residence Living Arrangements: Spouse/significant other                 Bathroom Shower/Tub: Tub/shower unit Shower/tub characteristics: Curtain  Bathroom Toilet:  (comfort height)                Prior Functioning/Environment Level of Independence: Needs assistance (at times due to fibromyalgia and hip pain)             OT Diagnosis: Generalized weakness   OT Problem List: Decreased strength;Decreased activity tolerance;Decreased knowledge of use of DME or AE;Pain   OT Treatment/Interventions: Self-care/ADL training;DME and/or AE instruction;Patient/family education    OT Goals(Current goals can be found in the care plan section) Acute Rehab OT Goals Patient Stated Goal: get healed and have other hip replaced OT Goal Formulation: With patient Time For Goal Achievement: 01/20/14 ADL Goals Pt Will Transfer to Toilet: with supervision;ambulating;bedside commode Pt Will Perform Tub/Shower Transfer: with min assist;Tub transfer;tub bench  OT Frequency: Min 2X/week   Barriers to D/C:             Co-evaluation              End of Session    Activity Tolerance: Patient tolerated treatment well (because nauseas during session but this passed) Patient left: in chair;with call bell/phone within reach;with family/visitor present   Time: 9471-2527 OT Time Calculation (min): 37 min Charges:  OT General Charges $OT Visit: 1 Procedure OT Evaluation $Initial OT Evaluation Tier I: 1 Procedure OT Treatments $Self Care/Home Management : 23-37 mins G-Codes:    Shaunie Boehm Feb 09, 2014, 11:35 AM  Lesle Chris, OTR/L [phone] Feb 09, 2014

## 2014-01-13 NOTE — Evaluation (Signed)
Physical Therapy Evaluation Patient Details Name: Stephanie Cobb MRN: [id number] DOB: 08/27/1960 Today's Date: 01/13/2014   History of Present Illness  s/p L DA THA  Clinical Impression  Pt s/p L THR presents with decreased L LE strength/ROM and post op pain limiting functional mobility.  Pt should progress to d/c home with family assist and HHPT follow up    Follow Up Recommendations Home health PT    Equipment Recommendations  Rolling walker with 5" wheels    Recommendations for Other Services OT consult     Precautions / Restrictions Precautions Precautions: Fall Restrictions Weight Bearing Restrictions: No Other Position/Activity Restrictions: WBAT      Mobility  Bed Mobility Overal bed mobility: Needs Assistance Bed Mobility: Supine to Sit     Supine to sit: Min assist;Mod assist     General bed mobility comments: cues for sequence and use of R LE to self assist  Transfers Overall transfer level: Needs assistance Equipment used: Rolling walker (2 wheeled) Transfers: Sit to/from Stand Sit to Stand: Supervision         General transfer comment: cues for UE/LE placement  Ambulation/Gait Ambulation/Gait assistance: Min assist;Mod assist Ambulation Distance (Feet): 150 Feet Assistive device: Rolling walker (2 wheeled) Gait Pattern/deviations: Step-to pattern;Step-through pattern;Decreased step length - right;Decreased step length - left;Shuffle;Trunk flexed     General Gait Details: cues for sequence, posture, position from RW and ER on L  Stairs            Wheelchair Mobility    Modified Rankin (Stroke Patients Only)       Balance                                             Pertinent Vitals/Pain Pain Assessment: 0-10 Pain Score: 1  Pain Location: L hip Pain Descriptors / Indicators: Sore Pain Intervention(s): Repositioned;Ice applied    Home Living Family/patient expects to be discharged to:: Private  residence Living Arrangements: Spouse/significant other Available Help at Discharge: Family Type of Home: House Home Access: Stairs to enter Entrance Stairs-Rails: None Secretary/administratorntrance Stairs-Number of Steps: 2 Home Layout: One level Home Equipment: None      Prior Function Level of Independence: Needs assistance (at times due to fibromyalgia and hip pain)               Hand Dominance        Extremity/Trunk Assessment   Upper Extremity Assessment: Overall WFL for tasks assessed           Lower Extremity Assessment: RLE deficits/detail;LLE deficits/detail RLE Deficits / Details: ltd hip abd/flex LLE Deficits / Details: hip strength 2+/5 wtih AAROM at hip to 95 flex and 20 abd     Communication   Communication: No difficulties  Cognition Arousal/Alertness: Awake/alert Behavior During Therapy: WFL for tasks assessed/performed Overall Cognitive Status: Within Functional Limits for tasks assessed                      General Comments      Exercises Total Joint Exercises Ankle Circles/Pumps: AROM;Both;10 reps;Supine Quad Sets: AROM;Both;10 reps;Supine Heel Slides: AAROM;Left;15 reps;Supine Hip ABduction/ADduction: AAROM;Left;15 reps;Supine      Assessment/Plan    PT Assessment Patient needs continued PT services  PT Diagnosis Difficulty walking   PT Problem List Decreased strength;Decreased range of motion;Decreased activity tolerance;Decreased mobility;Decreased knowledge of use of DME;Pain  PT Treatment Interventions DME instruction;Gait training;Stair training;Functional mobility training;Therapeutic activities;Therapeutic exercise;Patient/family education   PT Goals (Current goals can be found in the Care Plan section) Acute Rehab PT Goals Patient Stated Goal: get healed and have other hip replaced PT Goal Formulation: With patient Time For Goal Achievement: 01/21/14 Potential to Achieve Goals: Good    Frequency 7X/week   Barriers to discharge         Co-evaluation               End of Session Equipment Utilized During Treatment: Gait belt Activity Tolerance: Patient tolerated treatment well Patient left: in chair;with call bell/phone within reach;with family/visitor present Nurse Communication: Mobility status         Time: 2440-1027 PT Time Calculation (min): 30 min   Charges:   PT Evaluation $Initial PT Evaluation Tier I: 1 Procedure PT Treatments $Gait Training: 8-22 mins $Therapeutic Exercise: 8-22 mins   PT G Codes:          Mikail Goostree 01/13/2014, 12:41 PM

## 2014-01-13 NOTE — Progress Notes (Signed)
[facility]   Subjective: 1 Day Post-Op Procedure(s) (LRB): LEFT TOTAL HIP ARTHROPLASTY ANTERIOR APPROACH (Left)   Patient reports pain as mild, pain controlled. C/o some N&V. Otherwise no events throughout the night.    Objective:   VITALS:   Filed Vitals:   01/13/14 0511  BP: 104/64  Pulse: 93  Temp: 97.3 F (36.3 C)  Resp: 18    Dorsiflexion/Plantar flexion intact Incision: dressing C/D/I No cellulitis present Compartment soft  LABS  Recent Labs  01/13/14 0500  HGB 10.5*  HCT 31.2*  WBC 9.1  PLT 306     Recent Labs  01/13/14 0500  NA 137  K 5.5*  BUN 13  CREATININE 0.92  GLUCOSE 151*     Assessment/Plan: 1 Day Post-Op Procedure(s) (LRB): LEFT TOTAL HIP ARTHROPLASTY ANTERIOR APPROACH (Left) Foley cath d/c'ed Advance diet Up with therapy D/C IV fluids Discharge home with home health eventually, when ready.  Expected ABLA  Treated with iron and will observe  Overweight (BMI 25-29.9) Estimated body mass index is 26.64 kg/(m^2) as calculated from the following:   Height as of this encounter: 5\' 6"  (1.676 m).   Weight as of this encounter: 74.844 kg (165 lb). Patient also counseled that weight may inhibit the healing process Patient counseled that losing weight will help with future health issues     Stephanie AuerbachMatthew S. Maebel Cobb   PAC  01/13/2014, 8:36 AM

## 2014-01-14 ENCOUNTER — Encounter (HOSPITAL_COMMUNITY): Payer: Self-pay | Admitting: Orthopedic Surgery

## 2014-01-14 LAB — CBC
HCT: 26.6 % — ABNORMAL LOW (ref 36.0–46.0)
HEMOGLOBIN: 9.1 g/dL — AB (ref 12.0–15.0)
MCH: 31.7 pg (ref 26.0–34.0)
MCHC: 34.2 g/dL (ref 30.0–36.0)
MCV: 92.7 fL (ref 78.0–100.0)
Platelets: 249 10*3/uL (ref 150–400)
RBC: 2.87 MIL/uL — AB (ref 3.87–5.11)
RDW: 11.9 % (ref 11.5–15.5)
WBC: 8.7 10*3/uL (ref 4.0–10.5)

## 2014-01-14 LAB — BASIC METABOLIC PANEL
Anion gap: 9 (ref 5–15)
BUN: 14 mg/dL (ref 6–23)
CALCIUM: 8.2 mg/dL — AB (ref 8.4–10.5)
CO2: 28 mEq/L (ref 19–32)
CREATININE: 0.95 mg/dL (ref 0.50–1.10)
Chloride: 102 mEq/L (ref 96–112)
GFR calc Af Amer: 77 mL/min — ABNORMAL LOW (ref >90)
GFR calc non Af Amer: 66 mL/min — ABNORMAL LOW (ref >90)
Glucose, Bld: 117 mg/dL — ABNORMAL HIGH (ref 70–99)
Potassium: 4.2 mEq/L (ref 3.7–5.3)
Sodium: 139 mEq/L (ref 137–147)

## 2014-01-14 MED ORDER — HYDROCODONE-ACETAMINOPHEN 7.5-325 MG PO TABS: 1.0000 | ORAL_TABLET | ORAL | Status: DC | PRN

## 2014-01-14 MED ORDER — POLYETHYLENE GLYCOL 3350 17 G PO PACK: 17.0000 g | PACK | Freq: Two times a day (BID) | ORAL | Status: DC

## 2014-01-14 MED ORDER — ASPIRIN 325 MG PO TBEC: 325.0000 mg | DELAYED_RELEASE_TABLET | Freq: Two times a day (BID) | ORAL | Status: AC

## 2014-01-14 MED ORDER — METHOCARBAMOL 500 MG PO TABS
500.0000 mg | ORAL_TABLET | Freq: Four times a day (QID) | ORAL | Status: DC | PRN
Start: 2014-01-14 — End: 2014-02-24

## 2014-01-14 MED ORDER — FERROUS SULFATE 325 (65 FE) MG PO TABS: 325.0000 mg | ORAL_TABLET | Freq: Three times a day (TID) | ORAL | Status: DC

## 2014-01-14 MED ORDER — DSS 100 MG PO CAPS: 100.0000 mg | ORAL_CAPSULE | Freq: Two times a day (BID) | ORAL | Status: DC

## 2014-01-14 NOTE — Progress Notes (Signed)
RN reviewed discharge information with patient and husband. All questions answered.   Paperwork and prescriptions given.   NT rolled patient down in wheelchair to family car.   Patient discharge with equipment, and set up with home health.

## 2014-01-14 NOTE — Progress Notes (Signed)
Physical Therapy Treatment Patient Details Name: Stephanie Cobb MRN: [id number] DOB: 1960/12/21 Today's Date: 01/14/2014    History of Present Illness s/p L DA THA    PT Comments    POD # 2 pt ready to D/C to home.  Assisted OOB to BR.  Amb in hallway, practiced stairs and performed all THR TE's following handout HEP.    Follow Up Recommendations  Home health PT     Equipment Recommendations  Rolling walker with 5" wheels    Recommendations for Other Services       Precautions / Restrictions Precautions Precautions: Fall Restrictions Weight Bearing Restrictions: No Other Position/Activity Restrictions: WBAT    Mobility  Bed Mobility Overal bed mobility: Needs Assistance Bed Mobility: Supine to Sit     Supine to sit: Min guard     General bed mobility comments: demonstrated and instructed pt how to use a belt to assist L LE off bed.  Pt required increased time but completed task well.  Aslo demonstrated and instructed using a one step stool to get into high bed.     Transfers Overall transfer level: Needs assistance Equipment used: Rolling walker (2 wheeled) Transfers: Sit to/from Stand Sit to Stand: Supervision         General transfer comment: <25% VC's on proper hand placment and increased time.  Ambulation/Gait Ambulation/Gait assistance: Supervision Ambulation Distance (Feet): 250 Feet Assistive device: Rolling walker (2 wheeled) Gait Pattern/deviations: Step-to pattern;Step-through pattern;Decreased step length - right;Decreased step length - left;Trunk flexed Gait velocity: slow but functional   General Gait Details: <25% VC's on safety with turns and proper walker to self distance.     Stairs Stairs: Yes   Stair Management: Two rails Number of Stairs: 2 General stair comments: simulating door frame and holding to screen door pt was able to perform stairs  Wheelchair Mobility    Modified Rankin (Stroke Patients Only)       Balance                                     Cognition Arousal/Alertness: Awake/alert Behavior During Therapy: WFL for tasks assessed/performed Overall Cognitive Status: Within Functional Limits for tasks assessed                      Exercises      General Comments        Pertinent Vitals/Pain Pain Assessment: No/denies pain (premedicated)    Home Living                      Prior Function            PT Goals (current goals can now be found in the care plan section) Acute Rehab PT Goals Patient Stated Goal: get healed and have other hip replaced Progress towards PT goals: Progressing toward goals    Frequency       PT Plan      Co-evaluation             End of Session Equipment Utilized During Treatment: Gait belt Activity Tolerance: Patient tolerated treatment well       Time: 1115-1200 PT Time Calculation (min): 45 min  Charges:  $Gait Training: 8-22 mins $Therapeutic Exercise: 8-22 mins $Therapeutic Activity: 8-22 mins                    G Codes:  Rica Koyanagi  PTA [facility]  Acute  Rehab Pager      [phone]

## 2014-01-14 NOTE — Progress Notes (Signed)
Occupational Therapy Treatment and Discharge Patient Details Name: Stephanie Cobb MRN: [id number] DOB: 1960-10-06 Today's Date: 01/14/2014    History of present illness s/p L DA THA   OT comments  Goals met, pt able to perform toilet transfer with supervision and tub transfer with min assist.  All education completed as detailed below.  Pt to discharge home today.  Follow Up Recommendations  No OT follow up    Equipment Recommendations       Recommendations for Other Services      Precautions / Restrictions Precautions Precautions: Fall Restrictions Other Position/Activity Restrictions: WBAT       Mobility Bed Mobility Overal bed mobility:  (Pt up in chair.)                Transfers Overall transfer level: Needs assistance Equipment used: Rolling walker (2 wheeled) Transfers: Sit to/from Stand Sit to Stand: Supervision              Balance                                   ADL Overall ADL's : Needs assistance/impaired                         Toilet Transfer: Supervision/safety;BSC;Ambulation;RW (over toilet)   Toileting- Clothing Manipulation and Hygiene: Supervision/safety;Sit to/from stand   Tub/ Shower Transfer: Ambulation;Tub bench;Minimal assistance (for L LE)   Functional mobility during ADLs: Supervision/safety;Rolling walker General ADL Comments: Educated pt in placement of shower curtain to minimize chance of water on floor.  Recommended hand held shower head.  Instructed pt to attempt to wash or dress L LE daily without use of AE.  Instructed pt to sit momentarily at EOB and then stand momentarily when prior to ambulating especially when getting up at night. Educated in transporting items while using walker, safe footwear and technique for donning compression hose. Pt used notebook to write instructions down      Vision                     Perception     Praxis      Cognition   Behavior During  Therapy: [facility] for tasks assessed/performed Overall Cognitive Status: Within Functional Limits for tasks assessed                       Extremity/Trunk Assessment               Exercises     Shoulder Instructions       General Comments      Pertinent Vitals/ Pain       Pain Assessment: No/denies pain (premedicated)  Home Living                                          Prior Functioning/Environment              Frequency       Progress Toward Goals  OT Goals(current goals can now be found in the care plan section)  Progress towards OT goals: Goals met/education completed, patient discharged from OT  Acute Rehab OT Goals Patient Stated Goal: get healed and have other hip replaced  Plan Discharge plan remains appropriate    Co-evaluation  End of Session     Activity Tolerance Patient tolerated treatment well   Patient Left in chair;with call bell/phone within reach   Nurse Communication          Time: 7793-9030 OT Time Calculation (min): 25 min  Charges: OT General Charges $OT Visit: 1 Procedure OT Treatments $Self Care/Home Management : 23-37 mins  Malka So 01/14/2014, 12:51 PM [phone]

## 2014-01-14 NOTE — Progress Notes (Signed)
Patient ID: Stephanie Cobb, female   DOB: [birth year], 53 y.o.   MRN: [id number] Subjective: 2 Days Post-Op Procedure(s) (LRB): LEFT TOTAL HIP ARTHROPLASTY ANTERIOR APPROACH (Left)    Patient reports pain as moderate.  Not tolerating pain meds well but still feel she needs them Worried about leg lengths. Had a introductory conversation about this but will plan to review with her in more detail in the office prior to her right total hip in September  Objective:   VITALS:   Filed Vitals:   01/14/14 0613  BP: 107/61  Pulse: 78  Temp: 97.7 F (36.5 C)  Resp: 16    Neurovascular intact Incision: dressing C/D/I  LABS  Recent Labs  01/13/14 0500 01/14/14 0431  HGB 10.5* 9.1*  HCT 31.2* 26.6*  WBC 9.1 8.7  PLT 306 249     Recent Labs  01/13/14 0500 01/14/14 0431  NA 137 139  K 5.5* 4.2  BUN 13 14  CREATININE 0.92 0.95  GLUCOSE 151* 117*    No results found for this basename: LABPT, INR,  in the last 72 hours   Assessment/Plan: 2 Days Post-Op Procedure(s) (LRB): LEFT TOTAL HIP ARTHROPLASTY ANTERIOR APPROACH (Left)   Advance diet Up with therapy Discharge home with home health

## 2014-01-20 NOTE — Discharge Summary (Signed)
Physician Discharge Summary  Patient ID: Stephanie Cobb MRN: [id number] DOB/AGE: 09/26/1960 53 y.o.  Admit date: 01/12/2014 Discharge date: 01/14/2014   Procedures:  Procedure(s) (LRB): LEFT TOTAL HIP ARTHROPLASTY ANTERIOR APPROACH (Left)  Attending Physician:  Dr. Durene RomansMatthew Olin   Admission Diagnoses:   Left hip OA / pain  Discharge Diagnoses:  Principal Problem:   S/P left THA, AA Active Problems:   Overweight (BMI 25.0-29.9)   Postoperative anemia due to acute blood loss  Past Medical History  Diagnosis Date  . Hypertension   . Heart palpitations     NUCLEAR STRESS TEST 12/14/13 [facility]- NO ISCHEMIA, EF 74%  . Hypothyroidism   . Arthritis     OA / PAIN BOTH HIPS  . Pain     CHRONIC NECK PAIN - DDD CERVICOTHORACIC; HX OF CERVICA FUSION - PT STATES 2 AREAS HAVE NOT FUSED AND SHE WILL NEED FURTHER SURGERY IN THE FUTURE  . Fibromyalgia   . Headache(784.0)     MIGRAINES  . Hypertriglyceridemia   . ADD (attention deficit disorder)   . GERD (gastroesophageal reflux disease)   . Insomnia   . PONV (postoperative nausea and vomiting)   . Pneumonia     ABOUT 6 YRS AGO  . Depression   . Anxiety   . Kidney insufficiency     PT STATES BUN SLIGHTLY ELEVATED - HER DOCTORS ARE WATCHING  . Scoliosis     HPI: Stephanie Cobb, 53 y.o. female, has a history of pain and functional disability in the left hip(s) due to arthritis and patient has failed non-surgical conservative treatments for greater than 12 weeks to include NSAID's and/or analgesics, use of assistive devices and activity modification. Onset of symptoms was gradual starting 9+ years ago with gradually worsening course since that time.The patient noted no past surgery on the bilateral hip(s). Patient currently rates pain in the left hip at 10 out of 10 with activity. Patient has night pain, worsening of pain with activity and weight bearing, trendelenberg gait, pain that interfers with activities of daily living  and pain with passive range of motion. Patient has evidence of periarticular osteophytes and joint space narrowing by imaging studies. This condition presents safety issues increasing the risk of falls. There is no current active infection. Risks, benefits and expectations were discussed with the patient. Risks including but not limited to the risk of anesthesia, blood clots, nerve damage, blood vessel damage, failure of the prosthesis, infection and up to and including death. Patient understand the risks, benefits and expectations and wishes to proceed with surgery.   PCP: Gaye AlkenMOON,AMY, NP   Discharged Condition: good  Hospital Course:  Patient underwent the above stated procedure on 01/12/2014. Patient tolerated the procedure well and brought to the recovery room in good condition and subsequently to the floor.  POD #1 BP: 104/64 ; Pulse: 93 ; Temp: 97.3 F (36.3 C) ; Resp: 18  Patient reports pain as mild, pain controlled. C/o some N&V. Otherwise no events throughout the night.  Dorsiflexion/plantar flexion intact, incision: dressing C/D/I, no cellulitis present and compartment soft.   LABS  Basename    HGB  10.5  HCT  31.2   POD #2  BP: 107/61 ; Pulse: 78 ; Temp: 97.7 F (36.5 C) ; Resp: 16  Patient reports pain as moderate. Not tolerating pain meds well but still feel she needs them  Worried about leg lengths. Had a introductory conversation about this but will plan to review with her  in more detail in the office prior to her right total hip in September. Dorsiflexion/plantar flexion intact, incision: dressing C/D/I, no cellulitis present and compartment soft.   LABS  Basename    HGB  9.1  HCT  26.6    Discharge Exam: General appearance: alert, cooperative and no distress Extremities: Homans sign is negative, no sign of DVT, no edema, redness or tenderness in the calves or thighs and no ulcers, gangrene or trophic changes  Disposition: Home with follow up in 2 weeks   Follow-up  Information   Follow up with Shelda Pal, MD. Schedule an appointment as soon as possible for a visit in 2 weeks.   Specialty:  Orthopedic Surgery   Contact information:   [address] [place] [address] [phone]       Discharge Instructions   Call MD / Call 911    Complete by:  As directed   If you experience chest pain or shortness of breath, CALL 911 and be transported to the hospital emergency room.  If you develope a fever above 101 F, pus (white drainage) or increased drainage or redness at the wound, or calf pain, call your surgeon's office.     Change dressing    Complete by:  As directed   Maintain surgical dressing for 10-14 days, or until follow up in the clinic.     Constipation Prevention    Complete by:  As directed   Drink plenty of fluids.  Prune juice may be helpful.  You may use a stool softener, such as Colace (over the counter) 100 mg twice a day.  Use MiraLax (over the counter) for constipation as needed.     Diet - low sodium heart healthy    Complete by:  As directed      Discharge instructions    Complete by:  As directed   Maintain surgical dressing for 10-14 days, or until follow up in the clinic. Follow up in 2 weeks at [facility]. Call with any questions or concerns.     Increase activity slowly as tolerated    Complete by:  As directed      TED hose    Complete by:  As directed   Use stockings (TED hose) for 2 weeks on both leg(s).  You may remove them at night for sleeping.     Weight bearing as tolerated    Complete by:  As directed   Laterality:  left  Extremity:  Lower             Medication List    STOP taking these medications       meperidine 50 MG tablet  Commonly known as:  DEMEROL     nabumetone 500 MG tablet  Commonly known as:  RELAFEN      TAKE these medications       amphetamine-dextroamphetamine 20 MG tablet  Commonly known as:  ADDERALL  Take 20-40 mg by mouth 2 (two) times  daily. 2 tablets in the morning and 1 in the afternoon     aspirin 325 MG EC tablet  Take 1 tablet (325 mg total) by mouth 2 (two) times daily.     buPROPion 300 MG 24 hr tablet  Commonly known as:  WELLBUTRIN XL  Take 300 mg by mouth every morning.     diazepam 5 MG tablet  Commonly known as:  VALIUM  Take 5 mg by mouth every 8 (eight) hours as needed for muscle  spasms.     doxepin 50 MG capsule  Commonly known as:  SINEQUAN  Take 50 mg by mouth at bedtime.     DSS 100 MG Caps  Take 100 mg by mouth 2 (two) times daily.     DULoxetine 60 MG capsule  Commonly known as:  CYMBALTA  Take 60 mg by mouth every morning.     estradiol 2 MG tablet  Commonly known as:  ESTRACE  Take 2 mg by mouth daily.     ferrous sulfate 325 (65 FE) MG tablet  Take 1 tablet (325 mg total) by mouth 3 (three) times daily after meals.     fexofenadine 60 MG tablet  Commonly known as:  ALLEGRA  Take 60 mg by mouth daily as needed for allergies or rhinitis.     gabapentin 300 MG capsule  Commonly known as:  NEURONTIN  Take 300-600 mg by mouth 2 (two) times daily. 1 in the morning and 2 at night     GLUCOSAMINE CHONDR COMPLEX PO  Take 1 tablet by mouth 2 (two) times daily.     HYDROcodone-acetaminophen 7.5-325 MG per tablet  Commonly known as:  NORCO  Take 1-2 tablets by mouth every 4 (four) hours as needed for moderate pain.     levothyroxine 50 MCG tablet  Commonly known as:  SYNTHROID, LEVOTHROID  Take 50 mcg by mouth every morning.     methocarbamol 500 MG tablet  Commonly known as:  ROBAXIN  Take 1 tablet (500 mg total) by mouth every 6 (six) hours as needed for muscle spasms.     metoprolol succinate 50 MG 24 hr tablet  Commonly known as:  TOPROL-XL  Take 50 mg by mouth at bedtime. Take with or immediately following a meal.     montelukast 10 MG tablet  Commonly known as:  SINGULAIR  Take 10 mg by mouth every morning.     OVER THE COUNTER MEDICATION  Take 2-3 tablets by mouth 2  (two) times a week.     pantoprazole 40 MG tablet  Commonly known as:  PROTONIX  Take 40 mg by mouth at bedtime.     polyethylene glycol packet  Commonly known as:  MIRALAX / GLYCOLAX  Take 17 g by mouth 2 (two) times daily.     ramipril 5 MG capsule  Commonly known as:  ALTACE  Take 5 mg by mouth at bedtime.     rizatriptan 10 MG disintegrating tablet  Commonly known as:  MAXALT-MLT  Take 10 mg by mouth every 2 (two) hours as needed for migraine. May repeat in 2 hours if needed     SARSAPARILLA ROOT PO  Take 1 tablet by mouth daily.     SUPER OMEGA 3 PO  Take 2 capsules by mouth 2 (two) times daily.     Vitamin D-3 5000 UNITS Tabs  Take 5,000 Units by mouth daily.         Signed: [name]. Evann Erazo   PA-C  01/20/2014, 11:03 PM

## 2014-02-10 ENCOUNTER — Encounter (HOSPITAL_COMMUNITY): Payer: Self-pay | Admitting: Pharmacy Technician

## 2014-02-15 NOTE — Progress Notes (Signed)
Please put orders in Epic surgery 02-23-14 pre op 02-22-14 Thanks 

## 2014-02-16 NOTE — Progress Notes (Signed)
Please put orders in Epic surgery 02-23-14 pre op 02-22-14 Thanks

## 2014-02-18 NOTE — H&P (Signed)
TOTAL HIP ADMISSION H&P  Patient is admitted for right total hip arthroplasty, anterior approach (Right leg shortened because of scoliosis changes).  Subjective:  Chief Complaint: Right hip OA / pain  HPI: Stephanie Cobb, 53 y.o. female, has a history of pain and functional disability in the right hip(s) due to arthritis and patient has failed non-surgical conservative treatments for greater than 12 weeks to include NSAID's and/or analgesics, use of assistive devices and activity modification.  Onset of symptoms was gradual starting 9+ years ago with gradually worsening course since that time.The patient noted prior procedures of the hip to include arthroplasty on the left hip(s).  Patient currently rates pain in the right hip at 10 out of 10 with activity. Patient has night pain, worsening of pain with activity and weight bearing, trendelenberg gait, pain that interfers with activities of daily living and pain with passive range of motion. Patient has evidence of periarticular osteophytes and joint space narrowing by imaging studies. This condition presents safety issues increasing the risk of falls.  There is no current active infection.  Risks, benefits and expectations were discussed with the patient.  Risks including but not limited to the risk of anesthesia, blood clots, nerve damage, blood vessel damage, failure of the prosthesis, infection and up to and including death.  Patient understand the risks, benefits and expectations and wishes to proceed with surgery.   PCP: MOON,AMY, NP  D/C Plans:      Home with HHPT  Post-op Meds:       No Rx given   Tranexamic Acid:      To be given - IV    Decadron:      Is to be given  FYI:     Ancef and Vancomycin post-op   ASA post-op  Norco post-op   Patient Active Problem List   Diagnosis Date Noted  . Overweight (BMI 25.0-29.9) 01/13/2014  . Postoperative anemia due to acute blood loss 01/13/2014  . S/P left THA, 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   Past Medical  History  Diagnosis Date  . Hypertension   . Heart palpitations     NUCLEAR STRESS TEST 12/14/13 AT [facility]- NO ISCHEMIA, EF 74%  . Hypothyroidism   . Arthritis     OA / PAIN BOTH HIPS  . Pain     CHRONIC NECK PAIN - DDD CERVICOTHORACIC; HX OF CERVICA FUSION - PT STATES 2 AREAS HAVE NOT FUSED AND SHE WILL NEED FURTHER SURGERY IN THE FUTURE  . Fibromyalgia   . Headache(784.0)     MIGRAINES  . Hypertriglyceridemia   . ADD (attention deficit disorder)   . GERD (gastroesophageal reflux disease)   . Insomnia   . PONV (postoperative nausea and vomiting)   . Pneumonia     ABOUT 6 YRS AGO  . Depression   . Anxiety   . Kidney insufficiency     PT STATES BUN SLIGHTLY ELEVATED - HER DOCTORS ARE WATCHING  . Scoliosis     Past Surgical History  Procedure Laterality Date  . Tubal ligation    . Abdominal hysterectomy    . Cervical fusion  2011  . Total hip arthroplasty Left 01/12/2014    Procedure: LEFT TOTAL HIP ARTHROPLASTY ANTERIOR APPROACH;  Surgeon: Shelda Pal, MD;  Location: [facility];  Service: Orthopedics;  Laterality: Left;    No prescriptions prior to admission   Allergies  Allergen Reactions  . Mupirocin     PT STATES AFTER USING MUPIROCIN OINT IN HER NOSTRILS -  SHE EXPERIENCED SEVERE HEADACHE RIGHT SIDE OF HEAD, SEVERE PAIN RIGHT EAR AND NUMBNESS RIGHT SIDE OF HEAD AND SEVERE DIZZINESS.  . Other     PT STATES SHE CAN'T HAVE MRI - HAS METAL SCREWS IN HER SPINE FROM CERVICAL  FUSION.    History  Substance Use Topics  . Smoking status: Former Smoker -- 1.00 packs/day for 10 years    Types: Cigarettes  . Smokeless tobacco: Never Used  . Alcohol Use: No     Comment: QUIT SMOKING  ABOUT 6 YRS AGO    No family history on file.   Review of Systems  Constitutional: Negative.   Eyes: Negative.   Respiratory: Negative.   Cardiovascular: Negative.   Gastrointestinal: Positive for heartburn.  Genitourinary: Negative.   Musculoskeletal: Positive for back pain and  joint pain.  Skin: Negative.   Neurological: Positive for headaches.  Endo/Heme/Allergies: Negative.   Psychiatric/Behavioral: Positive for depression. The patient is nervous/anxious and has insomnia.     Objective:  Physical Exam  Constitutional: She is oriented to person, place, and time. She appears well-developed and well-nourished.  HENT:  Head: Normocephalic and atraumatic.  Mouth/Throat: Oropharynx is clear and moist.  Eyes: Pupils are equal, round, and reactive to light.  Neck: Neck supple. No JVD present. No tracheal deviation present. No thyromegaly present.  Cardiovascular: Normal rate, regular rhythm, normal heart sounds and intact distal pulses.   Respiratory: Effort normal and breath sounds normal. No stridor. No respiratory distress. She has no wheezes.  GI: Soft. There is no tenderness. There is no guarding.  Musculoskeletal:       Right hip: She exhibits decreased range of motion, decreased strength, tenderness and bony tenderness. She exhibits no swelling, no deformity and no laceration.       Left hip: She exhibits tenderness, swelling and laceration (incision healed well). She exhibits normal range of motion, normal strength, no bony tenderness, no crepitus and no deformity.  Lymphadenopathy:    She has no cervical adenopathy.  Neurological: She is alert and oriented to person, place, and time.  Skin: Skin is warm and dry.  Psychiatric: She has a normal mood and affect.     Labs:  Estimated body mass index is 26.81 kg/(m^2) as calculated from the following:   Height as of 12/30/13:  (1.676 m).   Weight as of 12/30/13: 75.297 kg (166 lb).   Imaging Review Plain radiographs demonstrate severe degenerative joint disease of the right hip(s). The bone quality appears to be good for age and reported activity level.  Assessment/Plan:  End stage arthritis, right hip(s)  The patient history, physical examination, clinical judgement of the provider and imaging  studies are consistent with end stage degenerative joint disease of the right hip(s) and total hip arthroplasty is deemed medically necessary. The treatment options including medical management, injection therapy, arthroscopy and arthroplasty were discussed at length. The risks and benefits of total hip arthroplasty were presented and reviewed. The risks due to aseptic loosening, infection, stiffness, dislocation/subluxation,  thromboembolic complications and other imponderables were discussed.  The patient acknowledged the explanation, agreed to proceed with the plan and consent was signed. Patient is being admitted for inpatient treatment for surgery, pain control, PT, OT, prophylactic antibiotics, VTE prophylaxis, progressive ambulation and ADL's and discharge planning.The patient is planning to be discharged home with home health services.      [name] Octavia Velador   PA-C  02/18/2014, 5:19 PM

## 2014-02-19 NOTE — Patient Instructions (Addendum)
Stephanie Cobb  02/19/2014   Your procedure is scheduled on:  02/23/2014    Report to [facility].  Follow the Signs to Short Stay Center at  0530      am  Call this number if you have problems the morning of surgery: [phone]   Remember:   Do not eat food or drink liquids after midnight.   Take these medicines the morning of surgery with A SIP OF WATER:Wellbutrin, Valium if needed, Cymbalta, Allegra if needed, Gabapentin, Hydrocodone if needed, Lwevothyroxine, singulair     Do not wear jewelry, make-up or nail polish.  Do not wear lotions, powders, or perfumes.  deodorant.  Do not shave 48 hours prior to surgery.   Do not bring valuables to the hospital.  Contacts, dentures or bridgework may not be worn into surgery.  Leave suitcase in the car. After surgery it may be brought to your room.  For patients admitted to the hospital, checkout time is 11:00 AM the day of  discharge.     :   [place] - Preparing for Surgery Before surgery, you can play an important role.  Because skin is not sterile, your skin needs to be as free of germs as possible.  You can reduce the number of germs on your skin by washing with CHG (chlorahexidine gluconate) soap before surgery.  CHG is an antiseptic cleaner which kills germs and bonds with the skin to continue killing germs even after washing. Please DO NOT use if you have an allergy to CHG or antibacterial soaps.  If your skin becomes reddened/irritated stop using the CHG and inform your nurse when you arrive at Short Stay. Do not shave (including legs and underarms) for at least 48 hours prior to the first CHG shower.  You may shave your face/neck. Please follow these instructions carefully:  1.  Shower with CHG Soap the night before surgery and the  morning of Surgery.  2.  If you choose to wash your hair, wash your hair first as usual with your  normal  shampoo.  3.  After you shampoo, rinse your hair and body thoroughly to remove the   shampoo.                           4.  Use CHG as you would any other liquid soap.  You can apply chg directly  to the skin and wash                       Gently with a scrungie or clean washcloth.  5.  Apply the CHG Soap to your body ONLY FROM THE NECK DOWN.   Do not use on face/ open                           Wound or open sores. Avoid contact with eyes, ears mouth and genitals (private parts).                       Wash face,  Genitals (private parts) with your normal soap.             6.  Wash thoroughly, paying special attention to the area where your surgery  will be performed.  7.  Thoroughly rinse your body with warm water from the neck down.  8.  DO NOT shower/wash with your  normal soap after using and rinsing off  the CHG Soap.                9.  Pat yourself dry with a clean towel.            10.  Wear clean pajamas.            11.  Place clean sheets on your bed the night of your first shower and do not  sleep with pets. Day of Surgery : Do not apply any lotions/deodorants the morning of surgery.  Please wear clean clothes to the hospital/surgery center.  FAILURE TO FOLLOW THESE INSTRUCTIONS MAY RESULT IN THE CANCELLATION OF YOUR SURGERY PATIENT SIGNATURE_________________________________  NURSE SIGNATURE__________________________________  ________________________________________________________________________  WHAT IS A BLOOD TRANSFUSION? Blood Transfusion Information  A transfusion is the replacement of blood or some of its parts. Blood is made up of multiple cells which provide different functions.  Red blood cells carry oxygen and are used for blood loss replacement.  White blood cells fight against infection.  Platelets control bleeding.  Plasma helps clot blood.  Other blood products are available for specialized needs, such as hemophilia or other clotting disorders. BEFORE THE TRANSFUSION  Who gives blood for transfusions?   Healthy volunteers who are fully  evaluated to make sure their blood is safe. This is blood bank blood. Transfusion therapy is the safest it has ever been in the practice of medicine. Before blood is taken from a donor, a complete history is taken to make sure that person has no history of diseases nor engages in risky social behavior (examples are intravenous drug use or sexual activity with multiple partners). The donor's travel history is screened to minimize risk of transmitting infections, such as malaria. The donated blood is tested for signs of infectious diseases, such as HIV and hepatitis. The blood is then tested to be sure it is compatible with you in order to minimize the chance of a transfusion reaction. If you or a relative donates blood, this is often done in anticipation of surgery and is not appropriate for emergency situations. It takes many days to process the donated blood. RISKS AND COMPLICATIONS Although transfusion therapy is very safe and saves many lives, the main dangers of transfusion include:   Getting an infectious disease.  Developing a transfusion reaction. This is an allergic reaction to something in the blood you were given. Every precaution is taken to prevent this. The decision to have a blood transfusion has been considered carefully by your caregiver before blood is given. Blood is not given unless the benefits outweigh the risks. AFTER THE TRANSFUSION  Right after receiving a blood transfusion, you will usually feel much better and more energetic. This is especially true if your red blood cells have gotten low (anemic). The transfusion raises the level of the red blood cells which carry oxygen, and this usually causes an energy increase.  The nurse administering the transfusion will monitor you carefully for complications. HOME CARE INSTRUCTIONS  No special instructions are needed after a transfusion. You may find your energy is better. Speak with your caregiver about any limitations on activity  for underlying diseases you may have. SEEK MEDICAL CARE IF:   Your condition is not improving after your transfusion.  You develop redness or irritation at the intravenous (IV) site. SEEK IMMEDIATE MEDICAL CARE IF:  Any of the following symptoms occur over the next 12 hours:  Shaking chills.  You have a temperature by mouth  above 102 F (38.9 C), not controlled by medicine.  Chest, back, or muscle pain.  People around you feel you are not acting correctly or are confused.  Shortness of breath or difficulty breathing.  Dizziness and fainting.  You get a rash or develop hives.  You have a decrease in urine output.  Your urine turns a dark color or changes to pink, red, or brown. Any of the following symptoms occur over the next 10 days:  You have a temperature by mouth above 102 F (38.9 C), not controlled by medicine.  Shortness of breath.  Weakness after normal activity.  The white part of the eye turns yellow (jaundice).  You have a decrease in the amount of urine or are urinating less often.  Your urine turns a dark color or changes to pink, red, or brown. Document Released: 05/18/2000 Document Revised: 08/13/2011 Document Reviewed: 01/05/2008 ExitCare Patient Information 2014 [place], [place].  _______________________________________________________________________  Incentive Spirometer  An incentive spirometer is a tool that can help keep your lungs clear and active. This tool measures how well you are filling your lungs with each breath. Taking long deep breaths may help reverse or decrease the chance of developing breathing (pulmonary) problems (especially infection) following:  A long period of time when you are unable to move or be active. BEFORE THE PROCEDURE   If the spirometer includes an indicator to show your best effort, your nurse or respiratory therapist will set it to a desired goal.  If possible, sit up straight or lean slightly forward. Try not  to slouch.  Hold the incentive spirometer in an upright position. INSTRUCTIONS FOR USE  1. Sit on the edge of your bed if possible, or sit up as far as you can in bed or on a chair. 2. Hold the incentive spirometer in an upright position. 3. Breathe out normally. 4. Place the mouthpiece in your mouth and seal your lips tightly around it. 5. Breathe in slowly and as deeply as possible, raising the piston or the ball toward the top of the column. 6. Hold your breath for 3-5 seconds or for as long as possible. Allow the piston or ball to fall to the bottom of the column. 7. Remove the mouthpiece from your mouth and breathe out normally. 8. Rest for a few seconds and repeat Steps 1 through 7 at least 10 times every 1-2 hours when you are awake. Take your time and take a few normal breaths between deep breaths. 9. The spirometer may include an indicator to show your best effort. Use the indicator as a goal to work toward during each repetition. 10. After each set of 10 deep breaths, practice coughing to be sure your lungs are clear. If you have an incision (the cut made at the time of surgery), support your incision when coughing by placing a pillow or rolled up towels firmly against it. Once you are able to get out of bed, walk around indoors and cough well. You may stop using the incentive spirometer when instructed by your caregiver.  RISKS AND COMPLICATIONS  Take your time so you do not get dizzy or light-headed.  If you are in pain, you may need to take or ask for pain medication before doing incentive spirometry. It is harder to take a deep breath if you are having pain. AFTER USE  Rest and breathe slowly and easily.  It can be helpful to keep track of a log of your progress. Your caregiver can provide  you with a simple table to help with this. If you are using the spirometer at home, follow these instructions: SEEK MEDICAL CARE IF:   You are having difficultly using the  spirometer.  You have trouble using the spirometer as often as instructed.  Your pain medication is not giving enough relief while using the spirometer.  You develop fever of 100.5 F (38.1 C) or higher. SEEK IMMEDIATE MEDICAL CARE IF:   You cough up bloody sputum that had not been present before.  You develop fever of 102 F (38.9 C) or greater.  You develop worsening pain at or near the incision site. MAKE SURE YOU:   Understand these instructions.  Will watch your condition.  Will get help right away if you are not doing well or get worse. Document Released: 10/01/2006 Document Revised: 08/13/2011 Document Reviewed: 12/02/2006 ExitCare Patient Information 2014 ExitCare, [place].   ________________________________________________________________________    Please read over the following fact sheets that you were given: MRSA Information, coughing and deep breathing exercises, leg exercises

## 2014-02-22 ENCOUNTER — Encounter (HOSPITAL_COMMUNITY)
Admission: RE | Admit: 2014-02-22 | Discharge: 2014-02-22 | Disposition: A | Discharge status: Home / Self Care | Payer: BC Managed Care – PPO | Source: Ambulatory Visit | Attending: Orthopedic Surgery | Admitting: Orthopedic Surgery

## 2014-02-22 ENCOUNTER — Encounter (HOSPITAL_COMMUNITY): Payer: Self-pay

## 2014-02-22 HISTORY — DX: Methicillin resistant Staphylococcus aureus infection, unspecified site: A49.02

## 2014-02-22 HISTORY — DX: Anemia, unspecified: D64.9

## 2014-02-22 LAB — BASIC METABOLIC PANEL
ANION GAP: 11 (ref 5–15)
BUN: 15 mg/dL (ref 6–23)
CO2: 26 mEq/L (ref 19–32)
Calcium: 9.2 mg/dL (ref 8.4–10.5)
Chloride: 103 mEq/L (ref 96–112)
Creatinine, Ser: 1.33 mg/dL — ABNORMAL HIGH (ref 0.50–1.10)
GFR calc Af Amer: 51 mL/min — ABNORMAL LOW (ref >90)
GFR, EST NON AFRICAN AMERICAN: 44 mL/min — AB (ref >90)
Glucose, Bld: 62 mg/dL — ABNORMAL LOW (ref 70–99)
Potassium: 4.3 mEq/L (ref 3.7–5.3)
SODIUM: 140 meq/L (ref 137–147)

## 2014-02-22 LAB — PROTIME-INR
INR: 1.01 (ref 0.00–1.49)
PROTHROMBIN TIME: 13.3 s (ref 11.6–15.2)

## 2014-02-22 LAB — URINALYSIS, ROUTINE W REFLEX MICROSCOPIC
BILIRUBIN URINE: NEGATIVE
Glucose, UA: NEGATIVE mg/dL
Ketones, ur: NEGATIVE mg/dL
Nitrite: NEGATIVE
Protein, ur: NEGATIVE mg/dL
SPECIFIC GRAVITY, URINE: 1.012 (ref 1.005–1.030)
Urobilinogen, UA: 0.2 mg/dL (ref 0.0–1.0)
pH: 5.5 (ref 5.0–8.0)

## 2014-02-22 LAB — URINE MICROSCOPIC-ADD ON

## 2014-02-22 LAB — CBC
HCT: 37.9 % (ref 36.0–46.0)
Hemoglobin: 12.8 g/dL (ref 12.0–15.0)
MCH: 31.9 pg (ref 26.0–34.0)
MCHC: 33.8 g/dL (ref 30.0–36.0)
MCV: 94.5 fL (ref 78.0–100.0)
PLATELETS: 354 10*3/uL (ref 150–400)
RBC: 4.01 MIL/uL (ref 3.87–5.11)
RDW: 12.8 % (ref 11.5–15.5)
WBC: 5.7 10*3/uL (ref 4.0–10.5)

## 2014-02-22 LAB — SURGICAL PCR SCREEN
MRSA, PCR: NEGATIVE
Staphylococcus aureus: POSITIVE — AB

## 2014-02-22 LAB — APTT: aPTT: 28 seconds (ref 24–37)

## 2014-02-22 NOTE — Progress Notes (Signed)
Attempted to call patient .  Had to leave message on voice mail .  Left message for patient to call me .  Attempting to reach patient due to glucose- 62 on lab results.  Attempted to reach patient second time and again no answer.

## 2014-02-22 NOTE — Anesthesia Preprocedure Evaluation (Addendum)
Anesthesia Evaluation  Patient identified by MRN, date of birth, ID band Patient awake    Reviewed: Allergy & Precautions, H&P , NPO status , Patient's Chart, lab work & pertinent test results, reviewed documented beta blocker date and time   History of Anesthesia Complications (+) PONV and history of anesthetic complications  Airway Mallampati: II TM Distance: >3 FB Neck ROM: Full    Dental no notable dental hx. (+) Chipped   Pulmonary former smoker,  breath sounds clear to auscultation  Pulmonary exam normal       Cardiovascular hypertension, Pt. on medications and Pt. on home beta blockers negative cardio ROS  Rhythm:Regular Rate:Normal     Neuro/Psych  Headaches, Anxiety Depression    GI/Hepatic Neg liver ROS, GERD-  Medicated and Controlled,  Endo/Other  Hypothyroidism   Renal/GU Renal diseasenegative Renal ROS  negative genitourinary   Musculoskeletal  (+) Arthritis -, Fibromyalgia -  Abdominal   Peds negative pediatric ROS (+)  Hematology negative hematology ROS (+) anemia ,   Anesthesia Other Findings   Reproductive/Obstetrics negative OB ROS                          Anesthesia Physical Anesthesia Plan  ASA: II  Anesthesia Plan: Spinal   Post-op Pain Management:    Induction: Intravenous  Airway Management Planned:   Additional Equipment:   Intra-op Plan:   Post-operative Plan:   Informed Consent: I have reviewed the patients History and Physical, chart, labs and discussed the procedure including the risks, benefits and alternatives for the proposed anesthesia with the patient or authorized representative who has indicated his/her understanding and acceptance.   Dental advisory given  Plan Discussed with: CRNA  Anesthesia Plan Comments:         Anesthesia Quick Evaluation

## 2014-02-22 NOTE — Progress Notes (Signed)
Spoke with patient .  Patient states she ate after leaving preop visit and feels fine.

## 2014-02-22 NOTE — Progress Notes (Signed)
Faxed via EPIC- Urinalysis and BMP results to Dr Charlann Boxer.

## 2014-02-23 ENCOUNTER — Inpatient Hospital Stay (HOSPITAL_COMMUNITY): Payer: BC Managed Care – PPO

## 2014-02-23 ENCOUNTER — Encounter (HOSPITAL_COMMUNITY): Payer: Self-pay | Admitting: *Deleted

## 2014-02-23 ENCOUNTER — Inpatient Hospital Stay (HOSPITAL_COMMUNITY)
Admission: RE | Admit: 2014-02-23 | Discharge: 2014-02-25 | DRG: 470 | Disposition: A | Discharge status: Home Health | Payer: BC Managed Care – PPO | Source: Ambulatory Visit | Attending: Orthopedic Surgery | Admitting: Orthopedic Surgery

## 2014-02-23 ENCOUNTER — Inpatient Hospital Stay (HOSPITAL_COMMUNITY): Payer: BC Managed Care – PPO | Admitting: Anesthesiology

## 2014-02-23 ENCOUNTER — Encounter (HOSPITAL_COMMUNITY): Payer: BC Managed Care – PPO | Admitting: Anesthesiology

## 2014-02-23 ENCOUNTER — Encounter (HOSPITAL_COMMUNITY)
Admission: RE | Disposition: A | Discharge status: Home Health | Payer: Self-pay | Source: Ambulatory Visit | Attending: Orthopedic Surgery

## 2014-02-23 DIAGNOSIS — Z01812 Encounter for preprocedural laboratory examination: Secondary | ICD-10-CM

## 2014-02-23 DIAGNOSIS — M169 Osteoarthritis of hip, unspecified: Secondary | ICD-10-CM | POA: Diagnosis present

## 2014-02-23 DIAGNOSIS — I10 Essential (primary) hypertension: Secondary | ICD-10-CM | POA: Diagnosis present

## 2014-02-23 DIAGNOSIS — F411 Generalized anxiety disorder: Secondary | ICD-10-CM | POA: Diagnosis present

## 2014-02-23 DIAGNOSIS — M1610 Unilateral primary osteoarthritis, unspecified hip: Principal | ICD-10-CM | POA: Diagnosis present

## 2014-02-23 DIAGNOSIS — K219 Gastro-esophageal reflux disease without esophagitis: Secondary | ICD-10-CM | POA: Diagnosis present

## 2014-02-23 DIAGNOSIS — D500 Iron deficiency anemia secondary to blood loss (chronic): Secondary | ICD-10-CM | POA: Diagnosis not present

## 2014-02-23 DIAGNOSIS — F329 Major depressive disorder, single episode, unspecified: Secondary | ICD-10-CM | POA: Diagnosis present

## 2014-02-23 DIAGNOSIS — Z6827 Body mass index (BMI) 27.0-27.9, adult: Secondary | ICD-10-CM | POA: Diagnosis not present

## 2014-02-23 DIAGNOSIS — M25559 Pain in unspecified hip: Secondary | ICD-10-CM | POA: Diagnosis present

## 2014-02-23 DIAGNOSIS — Z87891 Personal history of nicotine dependence: Secondary | ICD-10-CM

## 2014-02-23 DIAGNOSIS — E039 Hypothyroidism, unspecified: Secondary | ICD-10-CM | POA: Diagnosis present

## 2014-02-23 DIAGNOSIS — D62 Acute posthemorrhagic anemia: Secondary | ICD-10-CM | POA: Diagnosis not present

## 2014-02-23 DIAGNOSIS — F3289 Other specified depressive episodes: Secondary | ICD-10-CM | POA: Diagnosis present

## 2014-02-23 DIAGNOSIS — E663 Overweight: Secondary | ICD-10-CM | POA: Diagnosis present

## 2014-02-23 DIAGNOSIS — Z96649 Presence of unspecified artificial hip joint: Secondary | ICD-10-CM

## 2014-02-23 HISTORY — PX: TOTAL HIP ARTHROPLASTY: SHX124

## 2014-02-23 LAB — TYPE AND SCREEN
ABO/RH(D): O POS
ANTIBODY SCREEN: NEGATIVE

## 2014-02-23 SURGERY — ARTHROPLASTY, HIP, TOTAL, ANTERIOR APPROACH
Anesthesia: Spinal | Site: Hip | Laterality: Right

## 2014-02-23 MED ORDER — ONDANSETRON HCL 4 MG PO TABS: 4.0000 mg | ORAL_TABLET | Freq: Four times a day (QID) | ORAL | Status: DC | PRN

## 2014-02-23 MED ORDER — PROPOFOL 10 MG/ML IV BOLUS
INTRAVENOUS | Status: AC
  Filled 2014-02-23: qty 20

## 2014-02-23 MED ORDER — METHOCARBAMOL 1000 MG/10ML IJ SOLN
500.0000 mg | Freq: Four times a day (QID) | INTRAVENOUS | Status: DC | PRN
  Filled 2014-02-23: qty 5

## 2014-02-23 MED ORDER — DOXEPIN HCL 50 MG PO CAPS
50.0000 mg | ORAL_CAPSULE | Freq: Every day | ORAL | Status: DC
  Administered 2014-02-23 – 2014-02-24 (×2): 50 mg via ORAL
  Filled 2014-02-23 (×3): qty 1

## 2014-02-23 MED ORDER — MENTHOL 3 MG MT LOZG: 1.0000 | LOZENGE | OROMUCOSAL | Status: DC | PRN

## 2014-02-23 MED ORDER — VANCOMYCIN HCL IN DEXTROSE 1-5 GM/200ML-% IV SOLN
INTRAVENOUS | Status: AC
  Filled 2014-02-23: qty 200

## 2014-02-23 MED ORDER — CHLORHEXIDINE GLUCONATE 4 % EX LIQD: 60.0000 mL | Freq: Once | CUTANEOUS | Status: DC

## 2014-02-23 MED ORDER — AMPHETAMINE-DEXTROAMPHETAMINE 10 MG PO TABS
40.0000 mg | ORAL_TABLET | Freq: Every day | ORAL | Status: DC
  Administered 2014-02-24 – 2014-02-25 (×2): 40 mg via ORAL
  Filled 2014-02-23 (×2): qty 4

## 2014-02-23 MED ORDER — LEVOTHYROXINE SODIUM 50 MCG PO TABS
50.0000 ug | ORAL_TABLET | Freq: Every morning | ORAL | Status: DC
  Administered 2014-02-24 – 2014-02-25 (×2): 50 ug via ORAL
  Filled 2014-02-23 (×3): qty 1

## 2014-02-23 MED ORDER — BUPIVACAINE HCL (PF) 0.5 % IJ SOLN
INTRAMUSCULAR | Status: DC | PRN
  Administered 2014-02-23: 3 mL

## 2014-02-23 MED ORDER — FENTANYL CITRATE 0.05 MG/ML IJ SOLN: 25.0000 ug | INTRAMUSCULAR | Status: DC | PRN

## 2014-02-23 MED ORDER — METOPROLOL SUCCINATE ER 50 MG PO TB24
50.0000 mg | ORAL_TABLET | Freq: Every day | ORAL | Status: DC
  Administered 2014-02-23: 50 mg via ORAL
  Filled 2014-02-23: qty 1

## 2014-02-23 MED ORDER — FENTANYL CITRATE 0.05 MG/ML IJ SOLN
INTRAMUSCULAR | Status: DC | PRN
  Administered 2014-02-23: 100 ug via INTRAVENOUS

## 2014-02-23 MED ORDER — BUPIVACAINE HCL (PF) 0.5 % IJ SOLN
INTRAMUSCULAR | Status: AC
  Filled 2014-02-23: qty 30

## 2014-02-23 MED ORDER — TRANEXAMIC ACID 100 MG/ML IV SOLN
1000.0000 mg | Freq: Once | INTRAVENOUS | Status: AC
  Administered 2014-02-23: 1000 mg via INTRAVENOUS
  Filled 2014-02-23: qty 10

## 2014-02-23 MED ORDER — POTASSIUM CHLORIDE 2 MEQ/ML IV SOLN
100.0000 mL/h | INTRAVENOUS | Status: DC
  Administered 2014-02-23 – 2014-02-24 (×2): 100 mL/h via INTRAVENOUS
  Filled 2014-02-23 (×11): qty 1000

## 2014-02-23 MED ORDER — METOCLOPRAMIDE HCL 5 MG/ML IJ SOLN: 5.0000 mg | Freq: Three times a day (TID) | INTRAMUSCULAR | Status: DC | PRN

## 2014-02-23 MED ORDER — CELECOXIB 200 MG PO CAPS
200.0000 mg | ORAL_CAPSULE | Freq: Two times a day (BID) | ORAL | Status: DC
  Administered 2014-02-23 – 2014-02-25 (×4): 200 mg via ORAL
  Filled 2014-02-23 (×5): qty 1

## 2014-02-23 MED ORDER — PANTOPRAZOLE SODIUM 40 MG PO TBEC
40.0000 mg | DELAYED_RELEASE_TABLET | Freq: Every day | ORAL | Status: DC
  Administered 2014-02-24: 40 mg via ORAL
  Filled 2014-02-23 (×3): qty 1

## 2014-02-23 MED ORDER — METOCLOPRAMIDE HCL 10 MG PO TABS: 5.0000 mg | ORAL_TABLET | Freq: Three times a day (TID) | ORAL | Status: DC | PRN

## 2014-02-23 MED ORDER — BISACODYL 10 MG RE SUPP: 10.0000 mg | Freq: Every day | RECTAL | Status: DC | PRN

## 2014-02-23 MED ORDER — RIZATRIPTAN BENZOATE 10 MG PO TBDP: 10.0000 mg | ORAL_TABLET | ORAL | Status: DC | PRN

## 2014-02-23 MED ORDER — MIDAZOLAM HCL 2 MG/2ML IJ SOLN
INTRAMUSCULAR | Status: AC
  Filled 2014-02-23: qty 2

## 2014-02-23 MED ORDER — FERROUS SULFATE 325 (65 FE) MG PO TABS
325.0000 mg | ORAL_TABLET | Freq: Three times a day (TID) | ORAL | Status: DC
  Administered 2014-02-23 – 2014-02-25 (×5): 325 mg via ORAL
  Filled 2014-02-23 (×8): qty 1

## 2014-02-23 MED ORDER — CEFAZOLIN SODIUM-DEXTROSE 2-3 GM-% IV SOLR
2.0000 g | Freq: Four times a day (QID) | INTRAVENOUS | Status: AC
  Administered 2014-02-23 (×2): 2 g via INTRAVENOUS
  Filled 2014-02-23 (×2): qty 50

## 2014-02-23 MED ORDER — DOCUSATE SODIUM 100 MG PO CAPS
100.0000 mg | ORAL_CAPSULE | Freq: Two times a day (BID) | ORAL | Status: DC
  Administered 2014-02-24 – 2014-02-25 (×3): 100 mg via ORAL

## 2014-02-23 MED ORDER — SODIUM CHLORIDE 0.9 % IR SOLN
Status: DC | PRN
  Administered 2014-02-23: 1000 mL

## 2014-02-23 MED ORDER — AMPHETAMINE-DEXTROAMPHETAMINE 10 MG PO TABS
20.0000 mg | ORAL_TABLET | Freq: Every day | ORAL | Status: DC
  Administered 2014-02-23: 20 mg via ORAL
  Filled 2014-02-23: qty 2

## 2014-02-23 MED ORDER — CEFAZOLIN SODIUM-DEXTROSE 2-3 GM-% IV SOLR
2.0000 g | Freq: Once | INTRAVENOUS | Status: AC
  Administered 2014-02-23: 2 g via INTRAVENOUS

## 2014-02-23 MED ORDER — DIPHENHYDRAMINE HCL 25 MG PO CAPS: 25.0000 mg | ORAL_CAPSULE | Freq: Four times a day (QID) | ORAL | Status: DC | PRN

## 2014-02-23 MED ORDER — PHENOL 1.4 % MT LIQD
1.0000 | OROMUCOSAL | Status: DC | PRN
Start: 2014-02-23 — End: 2014-02-25

## 2014-02-23 MED ORDER — PROPOFOL INFUSION 10 MG/ML OPTIME
INTRAVENOUS | Status: DC | PRN
  Administered 2014-02-23: 80 ug/kg/min via INTRAVENOUS

## 2014-02-23 MED ORDER — ONDANSETRON HCL 4 MG/2ML IJ SOLN
4.0000 mg | Freq: Four times a day (QID) | INTRAMUSCULAR | Status: DC | PRN
Start: 2014-02-23 — End: 2014-02-25
  Administered 2014-02-24: 4 mg via INTRAVENOUS
  Filled 2014-02-23: qty 2

## 2014-02-23 MED ORDER — MONTELUKAST SODIUM 10 MG PO TABS
10.0000 mg | ORAL_TABLET | Freq: Every morning | ORAL | Status: DC
  Administered 2014-02-24 – 2014-02-25 (×2): 10 mg via ORAL
  Filled 2014-02-23 (×2): qty 1

## 2014-02-23 MED ORDER — MIDAZOLAM HCL 5 MG/5ML IJ SOLN
INTRAMUSCULAR | Status: DC | PRN
  Administered 2014-02-23: 2 mg via INTRAVENOUS

## 2014-02-23 MED ORDER — HYDROCODONE-ACETAMINOPHEN 7.5-325 MG PO TABS
1.0000 | ORAL_TABLET | ORAL | Status: DC
Start: 2014-02-23 — End: 2014-02-24
  Administered 2014-02-23: 1 via ORAL
  Administered 2014-02-23 (×2): 2 via ORAL
  Administered 2014-02-24: 1 via ORAL
  Administered 2014-02-24: 2 via ORAL
  Filled 2014-02-23 (×2): qty 2
  Filled 2014-02-23: qty 1
  Filled 2014-02-23 (×2): qty 2

## 2014-02-23 MED ORDER — FENTANYL CITRATE 0.05 MG/ML IJ SOLN
INTRAMUSCULAR | Status: AC
  Filled 2014-02-23: qty 2

## 2014-02-23 MED ORDER — ESTRADIOL 2 MG PO TABS
2.0000 mg | ORAL_TABLET | Freq: Every day | ORAL | Status: DC
  Administered 2014-02-24 – 2014-02-25 (×2): 2 mg via ORAL
  Filled 2014-02-23 (×2): qty 1

## 2014-02-23 MED ORDER — POLYETHYLENE GLYCOL 3350 17 G PO PACK
17.0000 g | PACK | Freq: Two times a day (BID) | ORAL | Status: DC
  Administered 2014-02-24: 17 g via ORAL

## 2014-02-23 MED ORDER — PHENYLEPHRINE 40 MCG/ML (10ML) SYRINGE FOR IV PUSH (FOR BLOOD PRESSURE SUPPORT)
PREFILLED_SYRINGE | INTRAVENOUS | Status: AC
  Filled 2014-02-23: qty 10

## 2014-02-23 MED ORDER — DEXAMETHASONE SODIUM PHOSPHATE 10 MG/ML IJ SOLN
10.0000 mg | Freq: Once | INTRAMUSCULAR | Status: AC
  Administered 2014-02-23: 10 mg via INTRAVENOUS

## 2014-02-23 MED ORDER — CEFAZOLIN SODIUM-DEXTROSE 2-3 GM-% IV SOLR
INTRAVENOUS | Status: AC
  Filled 2014-02-23: qty 50

## 2014-02-23 MED ORDER — MAGNESIUM CITRATE PO SOLN
1.0000 | Freq: Once | ORAL | Status: AC | PRN
Start: 2014-02-23 — End: 2014-02-23

## 2014-02-23 MED ORDER — ONDANSETRON HCL 4 MG/2ML IJ SOLN
INTRAMUSCULAR | Status: DC | PRN
  Administered 2014-02-23: 4 mg via INTRAVENOUS

## 2014-02-23 MED ORDER — VANCOMYCIN HCL IN DEXTROSE 1-5 GM/200ML-% IV SOLN
1000.0000 mg | Freq: Once | INTRAVENOUS | Status: AC
  Administered 2014-02-23: 1000 mg via INTRAVENOUS

## 2014-02-23 MED ORDER — LIDOCAINE HCL (CARDIAC) 20 MG/ML IV SOLN
INTRAVENOUS | Status: AC
  Filled 2014-02-23: qty 5

## 2014-02-23 MED ORDER — DIAZEPAM 5 MG PO TABS
5.0000 mg | ORAL_TABLET | Freq: Three times a day (TID) | ORAL | Status: DC | PRN
  Administered 2014-02-24 – 2014-02-25 (×2): 5 mg via ORAL
  Filled 2014-02-23 (×2): qty 1

## 2014-02-23 MED ORDER — GABAPENTIN 300 MG PO CAPS
300.0000 mg | ORAL_CAPSULE | Freq: Every day | ORAL | Status: DC
  Administered 2014-02-24 – 2014-02-25 (×2): 300 mg via ORAL
  Filled 2014-02-23 (×2): qty 1

## 2014-02-23 MED ORDER — ASPIRIN EC 325 MG PO TBEC
325.0000 mg | DELAYED_RELEASE_TABLET | Freq: Two times a day (BID) | ORAL | Status: DC
  Administered 2014-02-24 – 2014-02-25 (×3): 325 mg via ORAL
  Filled 2014-02-23 (×5): qty 1

## 2014-02-23 MED ORDER — DEXAMETHASONE SODIUM PHOSPHATE 10 MG/ML IJ SOLN
10.0000 mg | Freq: Once | INTRAMUSCULAR | Status: AC
  Administered 2014-02-24: 10 mg via INTRAVENOUS
  Filled 2014-02-23: qty 1

## 2014-02-23 MED ORDER — KETAMINE HCL 10 MG/ML IJ SOLN
INTRAMUSCULAR | Status: AC
  Filled 2014-02-23: qty 1

## 2014-02-23 MED ORDER — LACTATED RINGERS IV SOLN
INTRAVENOUS | Status: DC | PRN
  Administered 2014-02-23 (×2): via INTRAVENOUS

## 2014-02-23 MED ORDER — KETAMINE HCL 10 MG/ML IJ SOLN
INTRAMUSCULAR | Status: DC | PRN
  Administered 2014-02-23: 20 mg via INTRAVENOUS

## 2014-02-23 MED ORDER — DULOXETINE HCL 60 MG PO CPEP
60.0000 mg | ORAL_CAPSULE | Freq: Every morning | ORAL | Status: DC
  Administered 2014-02-24 – 2014-02-25 (×2): 60 mg via ORAL
  Filled 2014-02-23 (×2): qty 1

## 2014-02-23 MED ORDER — PROMETHAZINE HCL 25 MG/ML IJ SOLN: 6.2500 mg | INTRAMUSCULAR | Status: DC | PRN

## 2014-02-23 MED ORDER — ALUM & MAG HYDROXIDE-SIMETH 200-200-20 MG/5ML PO SUSP: 30.0000 mL | ORAL | Status: DC | PRN

## 2014-02-23 MED ORDER — PHENYLEPHRINE HCL 10 MG/ML IJ SOLN
INTRAMUSCULAR | Status: DC | PRN
  Administered 2014-02-23 (×3): 80 ug via INTRAVENOUS

## 2014-02-23 MED ORDER — BUPROPION HCL ER (XL) 300 MG PO TB24
300.0000 mg | ORAL_TABLET | Freq: Every morning | ORAL | Status: DC
  Administered 2014-02-24 – 2014-02-25 (×2): 300 mg via ORAL
  Filled 2014-02-23 (×2): qty 1

## 2014-02-23 MED ORDER — HYDROMORPHONE HCL 1 MG/ML IJ SOLN
0.5000 mg | INTRAMUSCULAR | Status: DC | PRN
Start: 2014-02-23 — End: 2014-02-25
  Administered 2014-02-23: 0.5 mg via INTRAVENOUS
  Administered 2014-02-23: 1 mg via INTRAVENOUS
  Administered 2014-02-24: 2 mg via INTRAVENOUS
  Filled 2014-02-23: qty 1
  Filled 2014-02-23: qty 2
  Filled 2014-02-23: qty 1

## 2014-02-23 MED ORDER — METHOCARBAMOL 500 MG PO TABS
500.0000 mg | ORAL_TABLET | Freq: Four times a day (QID) | ORAL | Status: DC | PRN
  Administered 2014-02-23 – 2014-02-24 (×4): 500 mg via ORAL
  Filled 2014-02-23 (×4): qty 1

## 2014-02-23 MED ORDER — LORATADINE 10 MG PO TABS
10.0000 mg | ORAL_TABLET | Freq: Every day | ORAL | Status: DC
  Administered 2014-02-24 – 2014-02-25 (×2): 10 mg via ORAL
  Filled 2014-02-23 (×3): qty 1

## 2014-02-23 MED ORDER — METOPROLOL SUCCINATE ER 50 MG PO TB24
50.0000 mg | ORAL_TABLET | Freq: Every day | ORAL | Status: DC
  Administered 2014-02-23 – 2014-02-24 (×2): 50 mg via ORAL
  Filled 2014-02-23 (×3): qty 1

## 2014-02-23 MED ORDER — GABAPENTIN 300 MG PO CAPS
600.0000 mg | ORAL_CAPSULE | Freq: Every day | ORAL | Status: DC
  Administered 2014-02-23 – 2014-02-24 (×2): 600 mg via ORAL
  Filled 2014-02-23 (×3): qty 2

## 2014-02-23 MED ORDER — ZOLPIDEM TARTRATE 5 MG PO TABS
5.0000 mg | ORAL_TABLET | Freq: Every evening | ORAL | Status: DC | PRN
  Administered 2014-02-23: 5 mg via ORAL
  Filled 2014-02-23: qty 1

## 2014-02-23 SURGICAL SUPPLY — 40 items
ADH SKN CLS APL DERMABOND .7 (GAUZE/BANDAGES/DRESSINGS) ×1
BAG SPEC THK2 15X12 ZIP CLS (MISCELLANEOUS)
BAG ZIPLOCK 12X15 (MISCELLANEOUS) IMPLANT
CAPT HIP PF COP ×1 IMPLANT
COVER PERINEAL POST (MISCELLANEOUS) ×2 IMPLANT
DERMABOND ADVANCED (GAUZE/BANDAGES/DRESSINGS) ×1
DERMABOND ADVANCED .7 DNX12 (GAUZE/BANDAGES/DRESSINGS) ×1 IMPLANT
DRAPE C-ARM 42X120 X-RAY (DRAPES) ×2 IMPLANT
DRAPE STERI IOBAN 125X83 (DRAPES) ×2 IMPLANT
DRAPE U-SHAPE 47X51 STRL (DRAPES) ×6 IMPLANT
DRSG AQUACEL AG ADV 3.5X10 (GAUZE/BANDAGES/DRESSINGS) ×2 IMPLANT
DURAPREP 26ML APPLICATOR (WOUND CARE) ×2 IMPLANT
ELECT BLADE TIP CTD 4 INCH (ELECTRODE) ×2 IMPLANT
ELECT PENCIL ROCKER SW 15FT (MISCELLANEOUS) ×1 IMPLANT
ELECT REM PT RETURN 15FT ADLT (MISCELLANEOUS) IMPLANT
ELECT REM PT RETURN 9FT ADLT (ELECTROSURGICAL) ×2
ELECTRODE REM PT RTRN 9FT ADLT (ELECTROSURGICAL) ×1 IMPLANT
FACESHIELD WRAPAROUND (MASK) ×8 IMPLANT
FACESHIELD WRAPAROUND OR TEAM (MASK) ×4 IMPLANT
GLOVE BIOGEL PI IND STRL 7.5 (GLOVE) ×1 IMPLANT
GLOVE BIOGEL PI IND STRL 8.5 (GLOVE) ×1 IMPLANT
GLOVE BIOGEL PI INDICATOR 7.5 (GLOVE) ×1
GLOVE BIOGEL PI INDICATOR 8.5 (GLOVE) ×1
GLOVE ECLIPSE 8.0 STRL XLNG CF (GLOVE) ×2 IMPLANT
GLOVE ORTHO TXT STRL SZ7.5 (GLOVE) ×4 IMPLANT
GOWN SPEC L3 XXLG W/TWL (GOWN DISPOSABLE) ×2 IMPLANT
GOWN STRL REUS W/TWL LRG LVL3 (GOWN DISPOSABLE) ×2 IMPLANT
HOLDER FOLEY CATH W/STRAP (MISCELLANEOUS) ×2 IMPLANT
KIT BASIN OR (CUSTOM PROCEDURE TRAY) ×2 IMPLANT
PACK TOTAL JOINT (CUSTOM PROCEDURE TRAY) ×2 IMPLANT
SAW OSC TIP CART 19.5X105X1.3 (SAW) ×2 IMPLANT
SUT MNCRL AB 4-0 PS2 18 (SUTURE) ×2 IMPLANT
SUT VIC AB 1 CT1 36 (SUTURE) ×6 IMPLANT
SUT VIC AB 2-0 CT1 27 (SUTURE) ×4
SUT VIC AB 2-0 CT1 TAPERPNT 27 (SUTURE) ×2 IMPLANT
SUT VLOC 180 0 24IN GS25 (SUTURE) ×2 IMPLANT
TOWEL OR 17X26 10 PK STRL BLUE (TOWEL DISPOSABLE) ×2 IMPLANT
TOWEL OR NON WOVEN STRL DISP B (DISPOSABLE) IMPLANT
TRAY FOLEY CATH 14FRSI W/METER (CATHETERS) ×2 IMPLANT
WATER STERILE IRR 1500ML POUR (IV SOLUTION) ×2 IMPLANT

## 2014-02-23 NOTE — Anesthesia Postprocedure Evaluation (Signed)
  Anesthesia Post-op Note  Patient: Stephanie Cobb  Procedure(s) Performed: Procedure(s) (LRB): RIGHT TOTAL HIP ARTHROPLASTY ANTERIOR APPROACH (Right)  Patient Location: PACU  Anesthesia Type: Spinal  Level of Consciousness: awake and alert   Airway and Oxygen Therapy: Patient Spontanous Breathing  Post-op Pain: mild  Post-op Assessment: Post-op Vital signs reviewed, Patient's Cardiovascular Status Stable, Respiratory Function Stable, Patent Airway and No signs of Nausea or vomiting  Last Vitals:  Filed Vitals:   02/23/14 0542  BP: 166/97  Pulse: 80  Temp: 37.1 C  Resp: 18    Post-op Vital Signs: stable   Complications: No apparent anesthesia complications

## 2014-02-23 NOTE — Progress Notes (Signed)
[facility]  [facility] is providing the following services: Recd orders for RW and Commode - patient already has equipment from a previous surgery.  If patient discharges after hours, please call [phone].   Renard Hamper 02/23/2014, 12:20 PM

## 2014-02-23 NOTE — Progress Notes (Signed)
Utilization review completed.  

## 2014-02-23 NOTE — Anesthesia Procedure Notes (Signed)
Spinal  Patient location during procedure: OR Start time: 02/23/2014 7:48 AM End time: 02/23/2014 7:55 AM Staffing CRNA/Resident: Carmelia Roller R Performed by: resident/CRNA  Preanesthetic Checklist Completed: patient identified, site marked, surgical consent, pre-op evaluation, timeout performed, IV checked, risks and benefits discussed and monitors and equipment checked Spinal Block Patient position: sitting Prep: Betadine Patient monitoring: heart rate, cardiac monitor, continuous pulse ox and blood pressure Approach: scoliosis with S curve to the Left. Location: L3-4 Injection technique: single-shot Needle Needle type: Sprotte  Needle gauge: 24 G Needle length: 9 cm Catheter at skin depth: 6 cm Assessment Sensory level: T4

## 2014-02-23 NOTE — Interval H&P Note (Signed)
History and Physical Interval Note:  02/23/2014 7:37 AM  Stephanie Cobb  has presented today for surgery, with the diagnosis of right hip osteoarthritis  The various methods of treatment have been discussed with the patient and family. After consideration of risks, benefits and other options for treatment, the patient has consented to  Procedure(s): RIGHT TOTAL HIP ARTHROPLASTY ANTERIOR APPROACH (Right) as a surgical intervention .  The patient's history has been reviewed, patient examined, no change in status, stable for surgery.  I have reviewed the patient's chart and labs.  Questions were answered to the patient's satisfaction.     Shelda Pal

## 2014-02-23 NOTE — Transfer of Care (Signed)
Immediate Anesthesia Transfer of Care Note  Patient: Stephanie Cobb  Procedure(s) Performed: Procedure(s): RIGHT TOTAL HIP ARTHROPLASTY ANTERIOR APPROACH (Right)  Patient Location: PACU  Anesthesia Type:Spinal  Level of Consciousness: sedated  Airway & Oxygen Therapy: Patient Spontanous Breathing and Patient connected to face mask oxygen  Post-op Assessment: Report given to PACU RN and Post -op Vital signs reviewed and stable  Post vital signs: Reviewed and stable  Complications: No apparent anesthesia complications

## 2014-02-23 NOTE — Evaluation (Signed)
Physical Therapy Evaluation Patient Details Name: Stephanie Cobb MRN: [id number] DOB: 06-26-1960 Today's Date: 02/23/2014   History of Present Illness  53 yo female s/p R THA-direct anterior 02/23/14. 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 Anticipate pt will progress well during stay.     Follow Up Recommendations Home health PT;Supervision/Assistance - 24 hour    Equipment Recommendations  None recommended by PT    Recommendations for Other Services OT consult     Precautions / Restrictions Precautions Precautions: Fall Restrictions Weight Bearing Restrictions: No RLE Weight Bearing: Weight bearing as tolerated      Mobility  Bed Mobility Overal bed mobility: Needs Assistance Bed Mobility: Supine to Sit     Supine to sit: Mod assist;+2 for physical assistance;+2 for safety/equipment;HOB elevated     General bed mobility comments: Increased time. Assist for trunk and R LE. VCs safety, technique, hand placmeent  Transfers Overall transfer level: Needs assistance Equipment used: Rolling walker (2 wheeled) Transfers: Sit to/from Stand Sit to Stand: Mod assist         General transfer comment: assist to rise, stabilize, control descent. VCS safety, technique, hand placement  Ambulation/Gait Ambulation/Gait assistance: Min guard Ambulation Distance (Feet): 50 Feet Assistive device: Rolling walker (2 wheeled) Gait Pattern/deviations: Decreased stride length;Antalgic;Step-to pattern     General Gait Details: slow gait speed. VCs safety, technique, sequence.   Stairs            Wheelchair Mobility    Modified Rankin (Stroke Patients Only)       Balance                                             Pertinent Vitals/Pain Pain Assessment: 0-10 Pain Score: 5  Pain Location: r hip Pain  Intervention(s): Limited activity within patient's tolerance;Monitored during session;Ice applied    Home Living Family/patient expects to be discharged to:: Private residence Living Arrangements: Spouse/significant other Available Help at Discharge: Family Type of Home: House Home Access: Stairs to enter Entrance Stairs-Rails: None Entrance Stairs-Number of Steps: 2 Home Layout: One level Home Equipment: Environmental consultant - 2 wheels;Bedside commode      Prior Function                 Hand Dominance        Extremity/Trunk Assessment   Upper Extremity Assessment: Defer to OT evaluation           Lower Extremity Assessment: RLE deficits/detail RLE Deficits / Details: hip abd 2/5, hip flex 2/5, moves ankle well    Cervical / Trunk Assessment: Normal  Communication   Communication: No difficulties  Cognition Arousal/Alertness: Awake/alert Behavior During Therapy: WFL for tasks assessed/performed Overall Cognitive Status: Within Functional Limits for tasks assessed                      General Comments      Exercises        Assessment/Plan    PT Assessment Patient needs continued PT services  PT Diagnosis Difficulty walking;Acute pain   PT Problem List Decreased strength;Decreased range of motion;Decreased activity tolerance;Decreased balance;Decreased mobility;Decreased knowledge of use of DME;Pain  PT Treatment Interventions DME instruction;Gait training;Stair training;Functional mobility training;Therapeutic activities;Patient/family  education;Balance training;Therapeutic exercise   PT Goals (Current goals can be found in the Care Plan section) Acute Rehab PT Goals Patient Stated Goal: less pain PT Goal Formulation: With patient Time For Goal Achievement: 03/02/14 Potential to Achieve Goals: Good    Frequency 7X/week   Barriers to discharge        Co-evaluation               End of Session Equipment Utilized During Treatment: Gait  belt Activity Tolerance: Patient limited by pain Patient left: in chair;with call bell/phone within reach           Time: 1636-1702 PT Time Calculation (min): 26 min   Charges:   PT Evaluation $Initial PT Evaluation Tier I: 1 Procedure PT Treatments $Gait Training: 8-22 mins $Therapeutic Activity: 8-22 mins   PT G Codes:          Rebeca Alert, MPT Pager: [phone]

## 2014-02-23 NOTE — Op Note (Signed)
NAME:  Stephanie Cobb                ACCOUNT NO.: [id number]      MEDICAL RECORD NO.: [id number]      FACILITY:  [facility]      PHYSICIAN:  Durene Romans D  DATE OF BIRTH:  [birth year]     DATE OF PROCEDURE:  02/23/2014                                 OPERATIVE REPORT         PREOPERATIVE DIAGNOSIS: Right  hip osteoarthritis.      POSTOPERATIVE DIAGNOSIS:  Right hip osteoarthritis.  Recent history of having left hip replaced     PROCEDURE:  Right total hip replacement through an anterior approach   utilizing DePuy THR system, component size 52mm pinnacle cup, a size 36+4 neutral   Altrex liner, a size 2 Hi Tri Lock stem with a 36+1.5 delta ceramic   ball.      SURGEON:  Madlyn Frankel. Charlann Boxer, M.D.      ASSISTANT:  Lanney Gins, PA-C     ANESTHESIA:  Spinal.      SPECIMENS:  None.      COMPLICATIONS:  None.      BLOOD LOSS:  250cc cc     DRAINS:  None.      INDICATION OF THE PROCEDURE:  Stephanie Cobb is a 53 y.o. female who had   presented to office for evaluation of right hip pain.  Radiographs revealed   progressive degenerative changes with bone-on-bone   articulation to the  hip joint.  The patient had painful limited range of   motion significantly affecting their overall quality of life.  The patient was failing to    respond to conservative measures, and at this point was ready   to proceed with more definitive measures.  The patient has noted progressive   degenerative changes in his hip, progressive problems and dysfunction   with regarding the hip prior to surgery.  We had long discussion about restoring leg lengths AS SHE PERCEIVED them.  I mean this as radiographically prior to her left hip replacement she was shorter not only as a result of her arthritis but perhaps pelvic obliquity related to scoliosis.  The intent of trying to restore her back to "normal" was reviewed in the office with plans to match up to 1cm difference for her.  Consent was  obtained for   benefit of pain relief.  Specific risk of infection, DVT, component   failure, dislocation, need for revision surgery, as well discussion of   the anterior versus posterior approach were reviewed.  Consent was   obtained for benefit of anterior pain relief through an anterior   approach.      PROCEDURE IN DETAIL:  The patient was brought to operative theater.   Once adequate anesthesia, preoperative antibiotics, 2gm of Ancef and 1 gm of Vancomycin added due to intolerance of mupiricin nasal swabs.   The patient was positioned supine on the OSI Hanna table.  Once adequate   padding of boney process was carried out, we had predraped out the hip, and  used fluoroscopy to confirm orientation of the pelvis and position, saving images for comparison standpoint during procedure.      The right hip was then prepped and draped from proximal iliac crest to   mid  thigh with shower curtain technique.      Time-out was performed identifying the patient, planned procedure, and   extremity.     An incision was then made 2 cm distal and lateral to the   anterior superior iliac spine extending over the orientation of the   tensor fascia lata muscle and sharp dissection was carried down to the   fascia of the muscle and protractor placed in the soft tissues.      The fascia was then incised.  The muscle belly was identified and swept   laterally and retractor placed along the superior neck.  Following   cauterization of the circumflex vessels and removing some pericapsular   fat, a second cobra retractor was placed on the inferior neck.  A third   retractor was placed on the anterior acetabulum after elevating the   anterior rectus.  A L-capsulotomy was along the line of the   superior neck to the trochanteric fossa, then extended proximally and   distally.  Tag sutures were placed and the retractors were then placed   intracapsular.  We then identified the trochanteric fossa and    orientation of my neck cut, confirmed this radiographically   and then made a neck osteotomy with the femur on traction.  The femoral   head was removed without difficulty or complication.  Traction was let   off and retractors were placed posterior and anterior around the   acetabulum.      The labrum and foveal tissue were debrided.  I began reaming with a 49mm   reamer and reamed up to 51mm reamer with good bony bed preparation and a 52mm   cup was chosen.  The final 52mm Pinnacle cup was then impacted under fluoroscopy  to confirm the depth of penetration and orientation with respect to   abduction.  A screw was placed followed by the hole eliminator.  The final   36+4 neutral Altrex liner was impacted with good visualized rim fit.  The cup was positioned anatomically within the acetabular portion of the pelvis.      At this point, the femur was rolled at 80 degrees.  Further capsule was   released off the inferior aspect of the femoral neck.  I then   released the superior capsule proximally.  The hook was placed laterally   along the femur and elevated manually and held in position with the bed   hook.  The leg was then extended and adducted with the leg rolled to 100   degrees of external rotation.  Once the proximal femur was fully   exposed, I used a box osteotome to set orientation.  I then began   broaching with the starting chili pepper broach and passed this by hand and then broached up to the 2 broach versus 3 on other side.  With the 2 broach in place I chose a high offset neck and did a trial reduction.  The offset was appropriate, leg lengths   appeared to be equal restored to normal again trying to match pre-operative leg lengths as well as based on our right hip pre-operative discussion.   Given these findings, I went ahead and dislocated the hip, repositioned all   retractors and positioned the right hip in the extended and abducted position.  The final 2 Hi Tri Lock stem  was   chosen and it was impacted down to the level of neck cut.  Based on this   and  the trial reduction, a 36+1.5 delta ceramic ball was chosen and   impacted onto a clean and dry trunnion, and the hip was reduced.  The   hip had been irrigated throughout the case again at this point.  I did   reapproximate the superior capsular leaflet to the anterior leaflet   using #1 Vicryl.  The fascia of the   tensor fascia lata muscle was then reapproximated using #1 Vicryl and #0 V-lock sutures.  The   remaining wound was closed with 2-0 Vicryl and running 4-0 Monocryl.   The hip was cleaned, dried, and dressed sterilely using Dermabond and   Aquacel dressing.  She was then brought   to recovery room in stable condition tolerating the procedure well.    Lanney Gins, PA-C was present for the entirety of the case involved from   preoperative positioning, perioperative retractor management, general   facilitation of the case, as well as primary wound closure as assistant.            Madlyn Frankel Charlann Boxer, M.D.        02/23/2014 9:20 AM

## 2014-02-24 ENCOUNTER — Encounter (HOSPITAL_COMMUNITY): Payer: Self-pay | Admitting: Orthopedic Surgery

## 2014-02-24 DIAGNOSIS — D500 Iron deficiency anemia secondary to blood loss (chronic): Secondary | ICD-10-CM

## 2014-02-24 HISTORY — DX: Iron deficiency anemia secondary to blood loss (chronic): D50.0

## 2014-02-24 LAB — CBC
HCT: 31.8 % — ABNORMAL LOW (ref 36.0–46.0)
Hemoglobin: 10.5 g/dL — ABNORMAL LOW (ref 12.0–15.0)
MCH: 31.6 pg (ref 26.0–34.0)
MCHC: 33 g/dL (ref 30.0–36.0)
MCV: 95.8 fL (ref 78.0–100.0)
PLATELETS: 256 10*3/uL (ref 150–400)
RBC: 3.32 MIL/uL — ABNORMAL LOW (ref 3.87–5.11)
RDW: 12.6 % (ref 11.5–15.5)
WBC: 9.2 10*3/uL (ref 4.0–10.5)

## 2014-02-24 LAB — BASIC METABOLIC PANEL
ANION GAP: 10 (ref 5–15)
BUN: 14 mg/dL (ref 6–23)
CO2: 26 mEq/L (ref 19–32)
CREATININE: 1.16 mg/dL — AB (ref 0.50–1.10)
Calcium: 8.3 mg/dL — ABNORMAL LOW (ref 8.4–10.5)
Chloride: 102 mEq/L (ref 96–112)
GFR calc non Af Amer: 52 mL/min — ABNORMAL LOW (ref >90)
GFR, EST AFRICAN AMERICAN: 60 mL/min — AB (ref >90)
Glucose, Bld: 142 mg/dL — ABNORMAL HIGH (ref 70–99)
POTASSIUM: 4 meq/L (ref 3.7–5.3)
Sodium: 138 mEq/L (ref 137–147)

## 2014-02-24 LAB — GLUCOSE, CAPILLARY: Glucose-Capillary: 119 mg/dL — ABNORMAL HIGH (ref 70–99)

## 2014-02-24 MED ORDER — OXYCODONE-ACETAMINOPHEN 5-325 MG PO TABS: 1.0000 | ORAL_TABLET | ORAL | Status: DC

## 2014-02-24 MED ORDER — HYDROCODONE-ACETAMINOPHEN 7.5-325 MG PO TABS
1.0000 | ORAL_TABLET | ORAL | Status: DC
  Administered 2014-02-24 – 2014-02-25 (×7): 1 via ORAL
  Filled 2014-02-24 (×7): qty 1

## 2014-02-24 MED ORDER — HYDROCODONE-ACETAMINOPHEN 7.5-325 MG PO TABS: 1.0000 | ORAL_TABLET | ORAL | Status: DC | PRN

## 2014-02-24 MED ORDER — METHOCARBAMOL 500 MG PO TABS: 500.0000 mg | ORAL_TABLET | Freq: Four times a day (QID) | ORAL | Status: DC | PRN

## 2014-02-24 MED ORDER — POLYETHYLENE GLYCOL 3350 17 G PO PACK: 17.0000 g | PACK | Freq: Two times a day (BID) | ORAL | Status: DC

## 2014-02-24 MED ORDER — ASPIRIN 325 MG PO TBEC: 325.0000 mg | DELAYED_RELEASE_TABLET | Freq: Two times a day (BID) | ORAL | Status: DC

## 2014-02-24 MED ORDER — DSS 100 MG PO CAPS: 100.0000 mg | ORAL_CAPSULE | Freq: Two times a day (BID) | ORAL | Status: DC

## 2014-02-24 NOTE — Progress Notes (Signed)
Physical Therapy Treatment Patient Details Name: Stephanie Cobb MRN: [id number] DOB: [birth year] Today's Date: 02/24/2014    History of Present Illness 53 yo female s/p R THA-direct anterior 02/23/14. Hx of L THA 01/2014, fibromyalgia    PT Comments    Progressing well with mobility. Plan is for d/c home on tomorrow.   Follow Up Recommendations  Home health PT;Supervision/Assistance - 24 hour (initially)     Equipment Recommendations  None recommended by PT    Recommendations for Other Services OT consult     Precautions / Restrictions Precautions Precautions: Fall Restrictions Weight Bearing Restrictions: No RLE Weight Bearing: Weight bearing as tolerated    Mobility  Bed Mobility Overal bed mobility: Needs Assistance Bed Mobility: Supine to Sit;Sit to Supine     Supine to sit: Supervision Sit to supine: Min assist   General bed mobility comments: assist with Rt. LE   Transfers Overall transfer level: Needs assistance Equipment used: Rolling walker (2 wheeled) Transfers: Sit to/from Stand Sit to Stand: Supervision Stand pivot transfers: Supervision       General transfer comment:  VCS safety, technique, hand placement  Ambulation/Gait Ambulation/Gait assistance: Supervision Ambulation Distance (Feet): 275 Feet Assistive device: Rolling walker (2 wheeled) Gait Pattern/deviations: Step-through pattern;Decreased stride length         Stairs Stairs: Yes Stairs assistance: Min guard Stair Management: Forwards;Two rails Number of Stairs: 5 General stair comments: up and over portable steps x 2. VCs safety, technique, sequence.   Wheelchair Mobility    Modified Rankin (Stroke Patients Only)       Balance                                    Cognition Arousal/Alertness: Awake/alert Behavior During Therapy: WFL for tasks assessed/performed Overall Cognitive Status: Within Functional Limits for tasks assessed                       Exercises      General Comments        Pertinent Vitals/Pain Pain Assessment: 0-10 Pain Score: 3  Pain Location: Right hip Pain Descriptors / Indicators: Aching Pain Intervention(s): Monitored during session;Ice applied    Home Living Family/patient expects to be discharged to:: Private residence Living Arrangements: Spouse/significant other Available Help at Discharge: Family Type of Home: House Home Access: Stairs to enter Entrance Stairs-Rails: None Home Layout: One level Home Equipment: Environmental consultant - 2 wheels;Bedside commode;Shower seat      Prior Function Level of Independence: Independent with assistive device(s)    ADL's / Homemaking Assistance Needed: Pt reports she was mod I with BADLs and had returned to work      PT Goals (current goals can now be found in the care plan section) Progress towards PT goals: Progressing toward goals    Frequency  7X/week    PT Plan Current plan remains appropriate    Co-evaluation             End of Session Equipment Utilized During Treatment: Gait belt Activity Tolerance: Patient tolerated treatment well Patient left: in bed;with call bell/phone within reach     Time: 1421-1436 PT Time Calculation (min): 15 min  Charges:  $Gait Training: 8-22 mins                    G Codes:      Rebeca Alert, MPT Pager: [phone]

## 2014-02-24 NOTE — Progress Notes (Signed)
     Subjective: 1 Day Post-Op Procedure(s) (LRB): RIGHT TOTAL HIP ARTHROPLASTY ANTERIOR APPROACH (Right)   Patient reports pain as mild, pain controlled. No events throughout the night. Feels that the legs are good after she stood on them last night.  Looking forward to working with PT.  Objective:   VITALS:   Filed Vitals:   02/24/14 0931  BP: 96/62  Pulse: 68  Temp: 98 F (36.7 C)  Resp: 16    Dorsiflexion/Plantar flexion intact Incision: dressing C/D/I No cellulitis present Compartment soft  LABS  Recent Labs  02/22/14 1445 02/24/14 0424  HGB 12.8 10.5*  HCT 37.9 31.8*  WBC 5.7 9.2  PLT 354 256     Recent Labs  02/22/14 1445 02/24/14 0424  NA 140 138  K 4.3 4.0  BUN 15 14  CREATININE 1.33* 1.16*  GLUCOSE 62* 142*     Assessment/Plan: 1 Day Post-Op Procedure(s) (LRB): RIGHT TOTAL HIP ARTHROPLASTY ANTERIOR APPROACH (Right) Foley cath d/c'ed Advance diet Up with therapy D/C IV fluids Discharge home with home health Follow up in 2 weeks at [facility]. Follow up with OLIN,Leena Tiede D in 2 weeks.  Contact information:  [facility] [address] [facility] [place] [address] [phone]    Expected ABLA  Treated with iron and will observe  Overweight (BMI 25-29.9) Estimated body mass index is 27.26 kg/(m^2) as calculated from the following:   Height as of this encounter:  (1.676 m).   Weight as of this encounter: 76.57 kg (168 lb 12.9 oz). Patient also counseled that weight may inhibit the healing process Patient counseled that losing weight will help with future health issues        [name]. Jory Tanguma   PAC  02/24/2014, 9:56 AM

## 2014-02-24 NOTE — Progress Notes (Signed)
Physical Therapy Treatment Patient Details Name: Stephanie Cobb MRN: [id number] DOB: November 24, 1960 Today's Date: 02/24/2014    History of Present Illness 53 yo female s/p R THA-direct anterior 02/23/14. Hx of L THA 01/2014, fibromyalgia    PT Comments    Progressing with mobility. BP somewhat low on today but pt tolerated activity fairly well. Plan is for d/c home on tomorrow.   Follow Up Recommendations  Home health PT;Supervision/Assistance - 24 hour (initially)     Equipment Recommendations  None recommended by PT    Recommendations for Other Services OT consult     Precautions / Restrictions Precautions Precautions: Fall Restrictions Weight Bearing Restrictions: No RLE Weight Bearing: Weight bearing as tolerated    Mobility  Bed Mobility               General bed mobility comments: pt sitting in recliner  Transfers Overall transfer level: Needs assistance Equipment used: Rolling walker (2 wheeled) Transfers: Sit to/from Stand Sit to Stand: Min assist         General transfer comment: assist to rise, stabilize, control descent. VCS safety, technique, hand placement  Ambulation/Gait Ambulation/Gait assistance: Min guard Ambulation Distance (Feet): 150 Feet Assistive device: Rolling walker (2 wheeled) Gait Pattern/deviations: Step-to pattern;Step-through pattern;Decreased stride length;Antalgic         Stairs            Wheelchair Mobility    Modified Rankin (Stroke Patients Only)       Balance                                    Cognition Arousal/Alertness: Awake/alert Behavior During Therapy: WFL for tasks assessed/performed Overall Cognitive Status: Within Functional Limits for tasks assessed                      Exercises Total Joint Exercises Ankle Circles/Pumps: AAROM;Both;15 reps;Seated Quad Sets: AAROM;Both;15 reps;Seated Heel Slides: AAROM;Right;15 reps;Seated Hip ABduction/ADduction: AAROM;Right;15  reps;Seated    General Comments        Pertinent Vitals/Pain Pain Assessment: 0-10 Pain Score: 5  Pain Location: r hip Pain Intervention(s): Limited activity within patient's tolerance;Ice applied    Home Living                      Prior Function            PT Goals (current goals can now be found in the care plan section) Progress towards PT goals: Progressing toward goals    Frequency  7X/week    PT Plan Current plan remains appropriate    Co-evaluation             End of Session Equipment Utilized During Treatment: Gait belt Activity Tolerance: Patient limited by fatigue;Patient limited by pain (BP low) Patient left: in chair;with call bell/phone within reach     Time: 1033-1047 PT Time Calculation (min): 14 min  Charges:  $Gait Training: 8-22 mins                    G Codes:      Rebeca Alert, MPT Pager: [phone]

## 2014-02-24 NOTE — Evaluation (Signed)
Occupational Therapy Evaluation Patient Details Name: Stephanie Cobb MRN: [id number] DOB: [birth year] Today's Date: 02/24/2014    History of Present Illness 53 yo female s/p R THA-direct anterior 02/23/14. Hx of L THA 01/2014, fibromyalgia   Clinical Impression   Patient evaluated by Occupational Therapy with no further acute OT needs identified. All education has been completed and the patient has no further questions. Pt requires min A for LB ADLs, and pt reports spouse will assist until she is able to access Rt. Foot.  All education completed.  See below for any follow-up Occupational Therapy or equipment needs. OT is signing off. Thank you for this referral.      Follow Up Recommendations  No OT follow up;Supervision - Intermittent    Equipment Recommendations       Recommendations for Other Services       Precautions / Restrictions Precautions Precautions: Fall Restrictions Weight Bearing Restrictions: No RLE Weight Bearing: Weight bearing as tolerated      Mobility Bed Mobility Overal bed mobility: Needs Assistance Bed Mobility: Supine to Sit;Sit to Supine     Supine to sit: Supervision Sit to supine: Min assist   General bed mobility comments: assist with Rt. LE   Transfers Overall transfer level: Needs assistance Equipment used: Rolling walker (2 wheeled) Transfers: Sit to/from [employer] Sit to Stand: Supervision Stand pivot transfers: Supervision       General transfer comment: assist to rise, stabilize, control descent. VCS safety, technique, hand placement    Balance                                            ADL Overall ADL's : Needs assistance/impaired Eating/Feeding: Independent   Grooming: Wash/dry hands;Supervision/safety;Standing   Upper Body Bathing: Supervision/ safety;Set up;Sitting   Lower Body Bathing: Minimal assistance;Sit to/from stand   Upper Body Dressing : Set up;Sitting   Lower Body  Dressing: Minimal assistance;Sit to/from stand Lower Body Dressing Details (indicate cue type and reason): Unable to access Rt. foot - is able to bend down to Rt ankle  Toilet Transfer: Supervision/safety;Ambulation;Comfort height toilet;RW   Toileting- Clothing Manipulation and Hygiene: Supervision/safety;Sit to/from stand       Functional mobility during ADLs: Supervision/safety;Rolling walker       Vision                     Perception     Praxis      Pertinent Vitals/Pain Pain Assessment: 0-10 Pain Score: 3  Pain Location: Rt hip Pain Descriptors / Indicators: Aching Pain Intervention(s): Monitored during session     Hand Dominance     Extremity/Trunk Assessment Upper Extremity Assessment Upper Extremity Assessment: Overall WFL for tasks assessed   Lower Extremity Assessment Lower Extremity Assessment: Defer to PT evaluation   Cervical / Trunk Assessment Cervical / Trunk Assessment: Normal   Communication Communication Communication: No difficulties   Cognition Arousal/Alertness: Awake/alert Behavior During Therapy: WFL for tasks assessed/performed Overall Cognitive Status: Within Functional Limits for tasks assessed                     General Comments       Exercises       Shoulder Instructions      Home Living Family/patient expects to be discharged to:: Private residence Living Arrangements: Spouse/significant other Available Help at Discharge: Family Type  of Home: House Home Access: Stairs to enter [employer] of Steps: 2 Entrance Stairs-Rails: None Home Layout: One level     Bathroom Shower/Tub: Tub/shower unit Shower/tub characteristics: Curtain Firefighter: Standard     Home Equipment: Environmental consultant - 2 wheels;Bedside commode;Shower seat          Prior Functioning/Environment Level of Independence: Independent with assistive device(s)    ADL's / Homemaking Assistance Needed: Pt reports she was mod I  with BADLs and had returned to work         OT Diagnosis:     OT Problem List:     OT Treatment/Interventions:      OT Goals(Current goals can be found in the care plan section)    OT Frequency:     Barriers to D/C:            Co-evaluation              End of Session Equipment Utilized During Treatment: Engineer, water Communication: Mobility status  Activity Tolerance: Patient tolerated treatment well Patient left: in bed;with call bell/phone within reach   Time: 1332-1403 OT Time Calculation (min): 31 min Charges:  OT General Charges $OT Visit: 1 Procedure OT Evaluation $Initial OT Evaluation Tier I: 1 Procedure OT Treatments $Self Care/Home Management : 8-22 mins $Therapeutic Activity: 8-22 mins G-Codes:    Stephanie Cobb M 03-09-2014, 2:09 PM

## 2014-02-25 LAB — BASIC METABOLIC PANEL
ANION GAP: 11 (ref 5–15)
BUN: 16 mg/dL (ref 6–23)
CALCIUM: 8.1 mg/dL — AB (ref 8.4–10.5)
CO2: 24 meq/L (ref 19–32)
Chloride: 101 mEq/L (ref 96–112)
Creatinine, Ser: 1.15 mg/dL — ABNORMAL HIGH (ref 0.50–1.10)
GFR calc non Af Amer: 53 mL/min — ABNORMAL LOW (ref >90)
GFR, EST AFRICAN AMERICAN: 61 mL/min — AB (ref >90)
Glucose, Bld: 124 mg/dL — ABNORMAL HIGH (ref 70–99)
Potassium: 3.6 mEq/L — ABNORMAL LOW (ref 3.7–5.3)
SODIUM: 136 meq/L — AB (ref 137–147)

## 2014-02-25 LAB — CBC
HCT: 27.2 % — ABNORMAL LOW (ref 36.0–46.0)
Hemoglobin: 9.1 g/dL — ABNORMAL LOW (ref 12.0–15.0)
MCH: 31.6 pg (ref 26.0–34.0)
MCHC: 33.5 g/dL (ref 30.0–36.0)
MCV: 94.4 fL (ref 78.0–100.0)
PLATELETS: 238 10*3/uL (ref 150–400)
RBC: 2.88 MIL/uL — AB (ref 3.87–5.11)
RDW: 12.8 % (ref 11.5–15.5)
WBC: 8 10*3/uL (ref 4.0–10.5)

## 2014-02-25 MED ORDER — ASPIRIN 325 MG PO TBEC: 325.0000 mg | DELAYED_RELEASE_TABLET | Freq: Two times a day (BID) | ORAL | Status: DC

## 2014-02-25 MED ORDER — HYDROCODONE-ACETAMINOPHEN 7.5-325 MG PO TABS: 1.0000 | ORAL_TABLET | ORAL | Status: DC | PRN

## 2014-02-25 MED ORDER — DIPHENHYDRAMINE HCL 25 MG PO CAPS: 25.0000 mg | ORAL_CAPSULE | Freq: Four times a day (QID) | ORAL | Status: DC | PRN

## 2014-02-25 NOTE — Progress Notes (Signed)
     Subjective: 2 Days Post-Op Procedure(s) (LRB): RIGHT TOTAL HIP ARTHROPLASTY ANTERIOR APPROACH (Right)   Patient reports pain as mild, pain controlled. Very sleep this morning after her Valium. No events throughout the night. Ready to be discharged home.  Objective:   VITALS:   Filed Vitals:   02/25/14 0517  BP: 95/68  Pulse: 60  Temp: 98 F (36.7 C)  Resp: 14    Dorsiflexion/Plantar flexion intact Incision: dressing C/D/I No cellulitis present Compartment soft  LABS  Recent Labs  02/22/14 1445 02/24/14 0424 02/25/14 0359  HGB 12.8 10.5* 9.1*  HCT 37.9 31.8* 27.2*  WBC 5.7 9.2 8.0  PLT 354 256 238     Recent Labs  02/22/14 1445 02/24/14 0424 02/25/14 0359  NA 140 138 136*  K 4.3 4.0 3.6*  BUN CREATININE 1.33* 1.16* 1.15*  GLUCOSE 62* 142* 124*     Assessment/Plan: 2 Days Post-Op Procedure(s) (LRB): RIGHT TOTAL HIP ARTHROPLASTY ANTERIOR APPROACH (Right) Up with therapy Discharge home with home health Follow up in 2 weeks at [facility]. Follow up with OLIN,Chudney Scheffler D in 2 weeks.  Contact information:  [facility] [address], [address] [place] [address] [phone]        [name]. Reymundo Winship   PAC  02/25/2014, 8:07 AM

## 2014-02-25 NOTE — Plan of Care (Signed)
Problem: Phase III Progression Outcomes Goal: Anticoagulant follow-up in place Outcome: Not Applicable Date Met:  62/82/41 ASA for VTE, no f/u needed.

## 2014-02-25 NOTE — Progress Notes (Signed)
Physical Therapy Treatment Patient Details Name: Stephanie Cobb MRN: [id number] DOB: 27-Feb-1961 Today's Date: 02/25/2014    History of Present Illness 53 yo female s/p R THA-direct anterior 02/23/14. Hx of L THA 01/2014, fibromyalgia    PT Comments    Progressing very well with mobility. Pt c/o mild pain and some stiffness but tolerated activity well. Practiced ambulation, stair negotiation, exercises. No further questions from pt/family member. Ready to d/c from PT standpoint.   Follow Up Recommendations  Home health PT;Supervision - Intermittent     Equipment Recommendations  None recommended by PT    Recommendations for Other Services OT consult     Precautions / Restrictions Precautions Precautions: Fall Restrictions Weight Bearing Restrictions: No RLE Weight Bearing: Weight bearing as tolerated    Mobility  Bed Mobility Overal bed mobility: Needs Assistance Bed Mobility: Supine to Sit     Supine to sit: Supervision        Transfers Overall transfer level: Needs assistance Equipment used: Rolling walker (2 wheeled) Transfers: Sit to/from Stand Sit to Stand: Supervision         General transfer comment:  VCS safety, technique, hand placement  Ambulation/Gait Ambulation/Gait assistance: Supervision Ambulation Distance (Feet): 250 Feet Assistive device: Rolling walker (2 wheeled) Gait Pattern/deviations: Step-through pattern;Decreased stride length     General Gait Details: VCS safety   Stairs Stairs: Yes Stairs assistance: Min guard Stair Management: Forwards;Step to pattern;One rail Right   General stair comments: close guard for safety. VCs safety.   Wheelchair Mobility    Modified Rankin (Stroke Patients Only)       Balance                                    Cognition Arousal/Alertness: Awake/alert Behavior During Therapy: WFL for tasks assessed/performed Overall Cognitive Status: Within Functional Limits for tasks  assessed                      Exercises Total Joint Exercises Hip ABduction/ADduction: AROM;Right;10 reps;Standing Long Arc Quad: AROM;Right;10 reps;Seated Knee Flexion: AROM;Right;10 reps;Standing Marching in Standing: AROM;Right;10 reps;Standing General Exercises - Lower Extremity Heel Raises: AROM;Both;10 reps;Standing    General Comments        Pertinent Vitals/Pain Pain Assessment: 0-10 Pain Score: 3  Pain Location: right hip Pain Descriptors / Indicators: Aching;Tightness Pain Intervention(s): Monitored during session    Home Living                      Prior Function            PT Goals (current goals can now be found in the care plan section) Progress towards PT goals: Progressing toward goals    Frequency  7X/week    PT Plan Current plan remains appropriate    Co-evaluation             End of Session Equipment Utilized During Treatment: Gait belt Activity Tolerance: Patient tolerated treatment well Patient left: in chair;with call bell/phone within reach;with family/visitor present     Time: 4098-1191 PT Time Calculation (min): 30 min  Charges:  $Gait Training: 8-22 mins $Therapeutic Exercise: 8-22 mins                    G Codes:      Rebeca Alert, MPT Pager: [phone]

## 2014-02-25 NOTE — Progress Notes (Signed)
Patient verbalized understanding of discharge instructions. Patient is stable at discharge. 

## 2014-02-25 NOTE — Progress Notes (Signed)
Physical Therapy Treatment Patient Details Name: Stephanie Cobb MRN: [id number] DOB: 11-Mar-1961 Today's Date: 02/25/2014    History of Present Illness 53 yo female s/p R THA-direct anterior 02/23/14. Hx of L THA 01/2014, fibromyalgia    PT Comments    Pt requested assistance to restroom and then to recliner. Requested PT check back after she finishes her breakfast for her PT session. Will check back later this am  Follow Up Recommendations  Home health PT;Supervision - Intermittent     Equipment Recommendations  None recommended by PT    Recommendations for Other Services OT consult     Precautions / Restrictions Precautions Precautions: Fall Restrictions Weight Bearing Restrictions: No RLE Weight Bearing: Weight bearing as tolerated    Mobility  Bed Mobility Overal bed mobility: Needs Assistance Bed Mobility: Supine to Sit     Supine to sit: Supervision        Transfers Overall transfer level: Needs assistance Equipment used: Rolling walker (2 wheeled) Transfers: Sit to/from Stand Sit to Stand: Supervision         General transfer comment:  VCS safety, technique, hand placement  Ambulation/Gait Ambulation/Gait assistance: Supervision Ambulation Distance (Feet): 15 Feet (x2) Assistive device: Rolling walker (2 wheeled) Gait Pattern/deviations: Step-through pattern;Decreased stride length     General Gait Details: VCS safety   Stairs Stairs: Yes Stairs assistance: Min guard Stair Management: Forwards;Step to pattern;One rail Right   General stair comments: close guard for safety. VCs safety.   Wheelchair Mobility    Modified Rankin (Stroke Patients Only)       Balance                                    Cognition Arousal/Alertness: Awake/alert Behavior During Therapy: WFL for tasks assessed/performed Overall Cognitive Status: Within Functional Limits for tasks assessed                      Exercises    General  Comments        Pertinent Vitals/Pain Pain Assessment: 0-10 Pain Score: 3  Pain Location: right hip Pain Descriptors / Indicators: Aching;Tightness Pain Intervention(s): Monitored during session    Home Living                      Prior Function            PT Goals (current goals can now be found in the care plan section) Progress towards PT goals: Progressing toward goals    Frequency  7X/week    PT Plan Current plan remains appropriate    Co-evaluation             End of Session Equipment Utilized During Treatment: Gait belt Activity Tolerance: Patient tolerated treatment well Patient left: in chair;with call bell/phone within reach;with nursing/sitter in room     Time: 4098-1191 PT Time Calculation (min): 9 min  Charges:  $Gait Training: 8-22 mins $Therapeutic Exercise: 8-22 mins                    G Codes:      Rebeca Alert, MPT Pager: [phone]

## 2014-03-08 NOTE — Discharge Summary (Signed)
Physician Discharge Summary  Patient ID: Stephanie Cobb MRN: [id number] DOB/AGE: 1961/01/05 53 y.o.  Admit date: 02/23/2014 Discharge date: 02/25/2014   Procedures:  Procedure(s) (LRB): RIGHT TOTAL HIP ARTHROPLASTY ANTERIOR APPROACH (Right)  Attending Physician:  Dr. Durene RomansMatthew Olin   Admission Diagnoses:   Right hip OA / pain  Discharge Diagnoses:  Principal Problem:   S/P right THA, AA Active Problems:   Overweight (BMI 25.0-29.9)   Expected blood loss anemia  Past Medical History  Diagnosis Date  . Hypertension   . Heart palpitations     NUCLEAR STRESS [facility]14/13 [facility]- NO ISCHEMIA, EF 74%  . Hypothyroidism   . Arthritis     OA / PAIN BOTH HIPS  . Pain     CHRONIC NECK PAIN - DDD CERVICOTHORACIC; HX OF CERVICA FUSION - PT STATES 2 AREAS HAVE NOT FUSED AND SHE WILL NEED FURTHER SURGERY IN THE FUTURE  . Fibromyalgia   . Headache(784.0)     MIGRAINES  . Hypertriglyceridemia   . ADD (attention deficit disorder)   . GERD (gastroesophageal reflux disease)   . Insomnia   . PONV (postoperative nausea and vomiting)   . Pneumonia     ABOUT 6 YRS AGO  . Depression   . Anxiety   . Kidney insufficiency     PT STATES BUN SLIGHTLY ELEVATED - HER DOCTORS ARE WATCHING  . Scoliosis   . Anemia   . MRSA (methicillin resistant Staphylococcus aureus)     hx of 2012- left upper leg - treated with Vancomycin     HPI: Stephanie RiserPaula P Cobb, 53 y.o. female, has a history of pain and functional disability in the right hip(s) due to arthritis and patient has failed non-surgical conservative treatments for greater than 12 weeks to include NSAID's and/or analgesics, use of assistive devices and activity modification. Onset of symptoms was gradual starting 9+ years ago with gradually worsening course since that time.The patient noted prior procedures of the hip to include arthroplasty on the left hip(s). Patient currently rates pain in the right hip at 10 out of 10 with activity.  Patient has night pain, worsening of pain with activity and weight bearing, trendelenberg gait, pain that interfers with activities of daily living and pain with passive range of motion. Patient has evidence of periarticular osteophytes and joint space narrowing by imaging studies. This condition presents safety issues increasing the risk of falls. There is no current active infection. Risks, benefits and expectations were discussed with the patient. Risks including but not limited to the risk of anesthesia, blood clots, nerve damage, blood vessel damage, failure of the prosthesis, infection and up to and including death. Patient understand the risks, benefits and expectations and wishes to proceed with surgery.  PCP: Gaye AlkenMOON,AMY, NP   Discharged Condition: good  Hospital Course:  Patient underwent the above stated procedure on 02/23/2014. Patient tolerated the procedure well and brought to the recovery room in good condition and subsequently to the floor.  POD #1 BP: 96/62 ; Pulse: 68 ; Temp: 98 F (36.7 C) ; Resp: 16  Patient reports pain as mild, pain controlled. No events throughout the night. Feels that the legs are good after she stood on them last night. Looking forward to working with PT. Dorsiflexion/plantar flexion intact, incision: dressing C/D/I, no cellulitis present and compartment soft.   LABS  Basename    HGB  10.5  HCT  31.8   POD #2  BP: 95/68 ; Pulse: 60 ; Temp: 98  F (36.7 C) ; Resp: 14  Patient reports pain as mild, pain controlled. Very sleep this morning after her Valium. No events throughout the night. Ready to be discharged home. Dorsiflexion/plantar flexion intact, incision: dressing C/D/I, no cellulitis present and compartment soft.   LABS  Basename    HGB  9.1  HCT  27.2    Discharge Exam: General appearance: alert, cooperative and no distress Extremities: Homans sign is negative, no sign of DVT, no edema, redness or tenderness in the calves or thighs and no  ulcers, gangrene or trophic changes  Disposition: Home with follow up in 2 weeks   Follow-up Information   Follow up with Shelda Pal, MD. Schedule an appointment as soon as possible for a visit in 2 weeks.   Specialty:  Orthopedic Surgery   Contact information:   [address] [place] [address] [phone]       Discharge Instructions   Call MD / Call 911    Complete by:  As directed   If you experience chest pain or shortness of breath, CALL 911 and be transported to the hospital emergency room.  If you develope a fever above 101 F, pus (white drainage) or increased drainage or redness at the wound, or calf pain, call your surgeon's office.     Change dressing    Complete by:  As directed   Maintain surgical dressing for 10-14 days, or until follow up in the clinic.     Constipation Prevention    Complete by:  As directed   Drink plenty of fluids.  Prune juice may be helpful.  You may use a stool softener, such as Colace (over the counter) 100 mg twice a day.  Use MiraLax (over the counter) for constipation as needed.     Diet - low sodium heart healthy    Complete by:  As directed      Discharge instructions    Complete by:  As directed   Maintain surgical dressing for 10-14 days, or until follow up in the clinic. Follow up in 2 weeks at [facility]. Call with any questions or concerns.     Increase activity slowly as tolerated    Complete by:  As directed      TED hose    Complete by:  As directed   Use stockings (TED hose) for 2 weeks on both leg(s).  You may remove them at night for sleeping.     Weight bearing as tolerated    Complete by:  As directed   Laterality:  right  Extremity:  Lower             Medication List    STOP taking these medications       nabumetone 500 MG tablet  Commonly known as:  RELAFEN      TAKE these medications       amphetamine-dextroamphetamine 20 MG tablet  Commonly known as:  ADDERALL    Take 20-40 mg by mouth 2 (two) times daily. 2 tablets in the morning and 1 in the afternoon     aspirin 325 MG EC tablet  Take 1 tablet (325 mg total) by mouth 2 (two) times daily.     buPROPion 300 MG 24 hr tablet  Commonly known as:  WELLBUTRIN XL  Take 300 mg by mouth every morning.     diazepam 5 MG tablet  Commonly known as:  VALIUM  Take 5 mg by mouth every 8 (eight) hours  as needed for muscle spasms.     doxepin 50 MG capsule  Commonly known as:  SINEQUAN  Take 50 mg by mouth at bedtime.     DSS 100 MG Caps  Take 100 mg by mouth 2 (two) times daily.     DULoxetine 60 MG capsule  Commonly known as:  CYMBALTA  Take 60 mg by mouth every morning.     estradiol 2 MG tablet  Commonly known as:  ESTRACE  Take 2 mg by mouth daily.     ferrous sulfate 325 (65 FE) MG tablet  Take 325 mg by mouth 3 (three) times daily after meals. Patient stopped on 02/08/2014     fexofenadine 60 MG tablet  Commonly known as:  ALLEGRA  Take 60 mg by mouth daily as needed for allergies or rhinitis.     gabapentin 300 MG capsule  Commonly known as:  NEURONTIN  Take 300-600 mg by mouth 2 (two) times daily. 1 in the morning and 2 at night     GLUCOSAMINE CHONDR COMPLEX PO  Take 1 tablet by mouth 2 (two) times daily.     HYDROcodone-acetaminophen 7.5-325 MG per tablet  Commonly known as:  NORCO  Take 1-2 tablets by mouth every 4 (four) hours as needed for moderate pain.     levothyroxine 50 MCG tablet  Commonly known as:  SYNTHROID, LEVOTHROID  Take 50 mcg by mouth every morning.     methocarbamol 500 MG tablet  Commonly known as:  ROBAXIN  Take 1 tablet (500 mg total) by mouth every 6 (six) hours as needed for muscle spasms.     metoprolol succinate 50 MG 24 hr tablet  Commonly known as:  TOPROL-XL  Take 50 mg by mouth at bedtime. Take with or immediately following a meal.     montelukast 10 MG tablet  Commonly known as:  SINGULAIR  Take 10 mg by mouth every morning.      pantoprazole 40 MG tablet  Commonly known as:  PROTONIX  Take 40 mg by mouth at bedtime.     polyethylene glycol packet  Commonly known as:  MIRALAX / GLYCOLAX  Take 17 g by mouth 2 (two) times daily.     ramipril 5 MG capsule  Commonly known as:  ALTACE  Take 5 mg by mouth at bedtime.     rizatriptan 10 MG disintegrating tablet  Commonly known as:  MAXALT-MLT  Take 10 mg by mouth every 2 (two) hours as needed for migraine. May repeat in 2 hours if needed     SUPER OMEGA 3 PO  Take 2 capsules by mouth 2 (two) times daily.     Vitamin D-3 5000 UNITS Tabs  Take 5,000 Units by mouth daily.         Signed: [name]. Noelene Gang   PA-C  03/08/2014, 3:59 PM

## 2014-12-01 ENCOUNTER — Other Ambulatory Visit: Payer: Self-pay | Admitting: Obstetrics and Gynecology

## 2014-12-02 LAB — CYTOLOGY - PAP

## 2014-12-03 ENCOUNTER — Other Ambulatory Visit: Payer: Self-pay | Admitting: Obstetrics and Gynecology

## 2014-12-03 DIAGNOSIS — R928 Other abnormal and inconclusive findings on diagnostic imaging of breast: Secondary | ICD-10-CM

## 2014-12-08 ENCOUNTER — Ambulatory Visit
Admission: RE | Admit: 2014-12-08 | Discharge: 2014-12-08 | Disposition: A | Discharge status: Home / Self Care | Payer: BLUE CROSS/BLUE SHIELD | Source: Ambulatory Visit | Attending: Obstetrics and Gynecology | Admitting: Obstetrics and Gynecology

## 2014-12-08 DIAGNOSIS — R928 Other abnormal and inconclusive findings on diagnostic imaging of breast: Secondary | ICD-10-CM

## 2015-10-04 DIAGNOSIS — I10 Essential (primary) hypertension: Secondary | ICD-10-CM | POA: Diagnosis not present

## 2015-10-04 DIAGNOSIS — F988 Other specified behavioral and emotional disorders with onset usually occurring in childhood and adolescence: Secondary | ICD-10-CM | POA: Diagnosis not present

## 2015-10-04 DIAGNOSIS — M542 Cervicalgia: Secondary | ICD-10-CM | POA: Diagnosis not present

## 2015-10-04 DIAGNOSIS — N289 Disorder of kidney and ureter, unspecified: Secondary | ICD-10-CM | POA: Diagnosis not present

## 2015-10-04 DIAGNOSIS — M545 Low back pain: Secondary | ICD-10-CM | POA: Diagnosis not present

## 2015-10-04 DIAGNOSIS — Z1389 Encounter for screening for other disorder: Secondary | ICD-10-CM | POA: Diagnosis not present

## 2015-10-04 DIAGNOSIS — E781 Pure hyperglyceridemia: Secondary | ICD-10-CM | POA: Diagnosis not present

## 2015-10-04 DIAGNOSIS — M797 Fibromyalgia: Secondary | ICD-10-CM | POA: Diagnosis not present

## 2015-10-05 DIAGNOSIS — M542 Cervicalgia: Secondary | ICD-10-CM | POA: Diagnosis not present

## 2015-10-05 DIAGNOSIS — M545 Low back pain: Secondary | ICD-10-CM | POA: Diagnosis not present

## 2015-10-11 DIAGNOSIS — M542 Cervicalgia: Secondary | ICD-10-CM | POA: Diagnosis not present

## 2015-10-11 DIAGNOSIS — M545 Low back pain: Secondary | ICD-10-CM | POA: Diagnosis not present

## 2015-10-13 DIAGNOSIS — M542 Cervicalgia: Secondary | ICD-10-CM | POA: Diagnosis not present

## 2015-10-13 DIAGNOSIS — M545 Low back pain: Secondary | ICD-10-CM | POA: Diagnosis not present

## 2015-10-17 DIAGNOSIS — M542 Cervicalgia: Secondary | ICD-10-CM | POA: Diagnosis not present

## 2015-10-17 DIAGNOSIS — M545 Low back pain: Secondary | ICD-10-CM | POA: Diagnosis not present

## 2015-10-25 DIAGNOSIS — J019 Acute sinusitis, unspecified: Secondary | ICD-10-CM | POA: Diagnosis not present

## 2015-10-25 DIAGNOSIS — J208 Acute bronchitis due to other specified organisms: Secondary | ICD-10-CM | POA: Diagnosis not present

## 2015-10-25 DIAGNOSIS — Z6828 Body mass index (BMI) 28.0-28.9, adult: Secondary | ICD-10-CM | POA: Diagnosis not present

## 2015-11-07 DIAGNOSIS — M545 Low back pain: Secondary | ICD-10-CM | POA: Diagnosis not present

## 2015-11-07 DIAGNOSIS — M542 Cervicalgia: Secondary | ICD-10-CM | POA: Diagnosis not present

## 2015-11-11 DIAGNOSIS — M545 Low back pain: Secondary | ICD-10-CM | POA: Diagnosis not present

## 2015-11-11 DIAGNOSIS — M542 Cervicalgia: Secondary | ICD-10-CM | POA: Diagnosis not present

## 2015-11-15 DIAGNOSIS — E039 Hypothyroidism, unspecified: Secondary | ICD-10-CM | POA: Diagnosis not present

## 2015-11-15 DIAGNOSIS — I10 Essential (primary) hypertension: Secondary | ICD-10-CM | POA: Diagnosis not present

## 2015-11-15 DIAGNOSIS — Z6829 Body mass index (BMI) 29.0-29.9, adult: Secondary | ICD-10-CM | POA: Diagnosis not present

## 2015-11-21 DIAGNOSIS — M7741 Metatarsalgia, right foot: Secondary | ICD-10-CM

## 2015-11-21 DIAGNOSIS — M7751 Other enthesopathy of right foot: Secondary | ICD-10-CM

## 2015-11-21 HISTORY — DX: Metatarsalgia, right foot: M77.41

## 2015-11-21 HISTORY — DX: Other enthesopathy of right foot and ankle: M77.51

## 2016-01-18 DIAGNOSIS — Z6829 Body mass index (BMI) 29.0-29.9, adult: Secondary | ICD-10-CM | POA: Diagnosis not present

## 2016-01-18 DIAGNOSIS — Z01419 Encounter for gynecological examination (general) (routine) without abnormal findings: Secondary | ICD-10-CM | POA: Diagnosis not present

## 2016-01-18 DIAGNOSIS — Z1231 Encounter for screening mammogram for malignant neoplasm of breast: Secondary | ICD-10-CM | POA: Diagnosis not present

## 2016-09-27 DIAGNOSIS — E039 Hypothyroidism, unspecified: Secondary | ICD-10-CM | POA: Diagnosis not present

## 2016-09-27 DIAGNOSIS — F988 Other specified behavioral and emotional disorders with onset usually occurring in childhood and adolescence: Secondary | ICD-10-CM | POA: Diagnosis not present

## 2016-09-27 DIAGNOSIS — I10 Essential (primary) hypertension: Secondary | ICD-10-CM | POA: Diagnosis not present

## 2016-09-27 DIAGNOSIS — M797 Fibromyalgia: Secondary | ICD-10-CM | POA: Diagnosis not present

## 2016-09-27 DIAGNOSIS — E781 Pure hyperglyceridemia: Secondary | ICD-10-CM | POA: Diagnosis not present

## 2016-09-28 DIAGNOSIS — H9313 Tinnitus, bilateral: Secondary | ICD-10-CM | POA: Diagnosis not present

## 2016-09-28 DIAGNOSIS — J342 Deviated nasal septum: Secondary | ICD-10-CM | POA: Diagnosis not present

## 2016-09-28 DIAGNOSIS — H903 Sensorineural hearing loss, bilateral: Secondary | ICD-10-CM | POA: Diagnosis not present

## 2016-10-01 DIAGNOSIS — J45909 Unspecified asthma, uncomplicated: Secondary | ICD-10-CM | POA: Diagnosis not present

## 2016-10-05 DIAGNOSIS — I10 Essential (primary) hypertension: Secondary | ICD-10-CM | POA: Diagnosis not present

## 2016-10-05 DIAGNOSIS — J208 Acute bronchitis due to other specified organisms: Secondary | ICD-10-CM | POA: Diagnosis not present

## 2017-01-07 DIAGNOSIS — F329 Major depressive disorder, single episode, unspecified: Secondary | ICD-10-CM | POA: Diagnosis not present

## 2017-01-07 DIAGNOSIS — I10 Essential (primary) hypertension: Secondary | ICD-10-CM | POA: Diagnosis not present

## 2017-01-07 DIAGNOSIS — F419 Anxiety disorder, unspecified: Secondary | ICD-10-CM | POA: Diagnosis not present

## 2017-01-07 DIAGNOSIS — Z1389 Encounter for screening for other disorder: Secondary | ICD-10-CM | POA: Diagnosis not present

## 2017-01-07 DIAGNOSIS — M797 Fibromyalgia: Secondary | ICD-10-CM | POA: Diagnosis not present

## 2017-01-09 ENCOUNTER — Other Ambulatory Visit: Payer: Self-pay | Admitting: Internal Medicine

## 2017-01-09 DIAGNOSIS — Z1231 Encounter for screening mammogram for malignant neoplasm of breast: Secondary | ICD-10-CM

## 2017-02-27 ENCOUNTER — Emergency Department (HOSPITAL_COMMUNITY): Payer: BLUE CROSS/BLUE SHIELD

## 2017-02-27 ENCOUNTER — Encounter (HOSPITAL_COMMUNITY): Payer: Self-pay | Admitting: Emergency Medicine

## 2017-02-27 ENCOUNTER — Emergency Department (HOSPITAL_COMMUNITY)
Admission: EM | Admit: 2017-02-27 | Discharge: 2017-02-27 | Disposition: A | Discharge status: Home / Self Care | Payer: BLUE CROSS/BLUE SHIELD | Attending: Emergency Medicine | Admitting: Emergency Medicine

## 2017-02-27 DIAGNOSIS — Z87891 Personal history of nicotine dependence: Secondary | ICD-10-CM | POA: Insufficient documentation

## 2017-02-27 DIAGNOSIS — Z981 Arthrodesis status: Secondary | ICD-10-CM | POA: Diagnosis not present

## 2017-02-27 DIAGNOSIS — R51 Headache: Secondary | ICD-10-CM | POA: Diagnosis not present

## 2017-02-27 DIAGNOSIS — Z7982 Long term (current) use of aspirin: Secondary | ICD-10-CM | POA: Diagnosis not present

## 2017-02-27 DIAGNOSIS — I10 Essential (primary) hypertension: Secondary | ICD-10-CM | POA: Diagnosis not present

## 2017-02-27 DIAGNOSIS — R519 Headache, unspecified: Secondary | ICD-10-CM

## 2017-02-27 DIAGNOSIS — M452 Ankylosing spondylitis of cervical region: Secondary | ICD-10-CM | POA: Diagnosis not present

## 2017-02-27 DIAGNOSIS — M542 Cervicalgia: Secondary | ICD-10-CM | POA: Insufficient documentation

## 2017-02-27 DIAGNOSIS — M4322 Fusion of spine, cervical region: Secondary | ICD-10-CM | POA: Diagnosis not present

## 2017-02-27 DIAGNOSIS — E039 Hypothyroidism, unspecified: Secondary | ICD-10-CM | POA: Insufficient documentation

## 2017-02-27 DIAGNOSIS — Z79899 Other long term (current) drug therapy: Secondary | ICD-10-CM | POA: Insufficient documentation

## 2017-02-27 MED ORDER — DIPHENHYDRAMINE HCL 50 MG/ML IJ SOLN
25.0000 mg | Freq: Once | INTRAMUSCULAR | Status: AC
  Administered 2017-02-27: 25 mg via INTRAVENOUS
  Filled 2017-02-27: qty 1

## 2017-02-27 MED ORDER — SODIUM CHLORIDE 0.9 % IV BOLUS (SEPSIS)
500.0000 mL | Freq: Once | INTRAVENOUS | Status: AC
  Administered 2017-02-27: 500 mL via INTRAVENOUS

## 2017-02-27 MED ORDER — DEXAMETHASONE SODIUM PHOSPHATE 10 MG/ML IJ SOLN
10.0000 mg | Freq: Once | INTRAMUSCULAR | Status: AC
  Administered 2017-02-27: 10 mg via INTRAVENOUS
  Filled 2017-02-27: qty 1

## 2017-02-27 MED ORDER — METOCLOPRAMIDE HCL 5 MG/ML IJ SOLN
10.0000 mg | Freq: Once | INTRAMUSCULAR | Status: AC
  Administered 2017-02-27: 10 mg via INTRAVENOUS
  Filled 2017-02-27: qty 2

## 2017-02-27 NOTE — ED Notes (Signed)
Pt taken to CT.

## 2017-02-27 NOTE — ED Triage Notes (Signed)
Pt c/o recurrent headaches- "I am missing too much work" has had a hx of same-- headache recurs about every 6 weeks, and with weather changes.

## 2017-02-27 NOTE — ED Provider Notes (Signed)
Care assumed at shift change from [facility], PA-C, tending CT head and C-spine. Patient presenting with chronic headaches, however today is different from her usual headache as she had severe pressure in the back of her head. Reports associated photophobia and bilateral decreased sensation to hands and legs. [place], PA-C reports no focal neuro deficits. Patient has received migraine cocktail consisting of Benadryl, Reglan, Decadron. Plan is to reassess after CT imaging. If migraine persists, plan for Toradol. Pt noted to be hypertensive, however it is reported she vomited her blood pressure medication today and hypertension is not likely cause of her headaches today. Physical Exam  BP (!) 173/93 (BP Location: Right Arm)   Pulse 73   Temp 97.8 F (36.6 C) (Oral)   Resp 16   SpO2 99%   Physical Exam  Constitutional: She appears well-developed and well-nourished. No distress.  Pt resting comfortably in bed.  HENT:  Head: Normocephalic and atraumatic.  Eyes: Conjunctivae are normal.  Cardiovascular: Normal rate.   Pulmonary/Chest: Effort normal.  Abdominal: Soft.  Neurological: She is alert.  Skin: Skin is warm.  Psychiatric: She has a normal mood and affect. Her behavior is normal.  Nursing note and vitals reviewed.   ED Course  Procedures  MDM Pt presenting with chronic headaches, however change in quality today. Patient reported to have concern of an etiology of headaches as she has strong family history of CAD and stroke. She also with history of C-spine fusion and concern of hardware with worsening headaches. CT head and C-spine ordered by previous provider. On exam, patient without focal neuro deficits. No strokelike symptoms. On re-evaluation, Pt reporting significant improvement in symptoms. Negative CT head. Stable C-spine hardware, with degenerative changes noted C7-T3. Discussed CT results and recommended pt follow up w PCP about degenerative changes in lower C and upper T  spine. Patient is safe for discharge with PCP follow-up. Strict return precautions discussed.  Discussed results, findings, treatment and follow up. Patient advised of return precautions. Patient verbalized understanding and agreed with plan.      R[place], [place] N, PA-C 02/28/17 1610    Alvira Monday, MD 02/28/17 [phone]

## 2017-02-27 NOTE — ED Provider Notes (Signed)
[facility]-EMERGENCY DEPT Provider Note   CSN: [id number] Arrival date & time: 02/27/17  1502     History   Chief Complaint Chief Complaint  Patient presents with  . Headache    HPI Stephanie Cobb is a 56 y.o. female.  Patient with history of hypertension, migraine headaches, fibromyalgia, neck pain s/p fusion -- presents with c/o severe headache for the past several days. Pain radiates to the neck. Associated photophobia and vomiting. Onset of symptoms were gradual. Symptoms were improved with home Flexeril 2 days ago. Patient had severe pressure in the back of her head which is unusual for her. Pain today is more around the sides and in the front. 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 No fevers, sinus pressure, toothaches. No weakness in arms or legs. With the headache 2 days ago patient describes decreased/numb sensation in her hands bilaterally as well as her bilateral thighs and down her lower legs. This was not associated with weakness. She has been walking without difficulty. No vomiting. No preceding head injuries. Patient is not on any blood thinners.      Past Medical History:  Diagnosis Date  . ADD (attention deficit disorder)   . Anemia   . Anxiety   . Arthritis    OA / PAIN BOTH HIPS  . Depression   . Fibromyalgia   . GERD (gastroesophageal reflux disease)   . Headache(784.0)    MIGRAINES  . Heart palpitations    NUCLEAR STRESS TEST 12/14/13 AT [facility]- NO ISCHEMIA, EF 74%  . Hypertension   . Hypertriglyceridemia   . Hypothyroidism   . Insomnia   . Kidney insufficiency    PT STATES BUN SLIGHTLY ELEVATED - HER DOCTORS ARE WATCHING  . MRSA (methicillin resistant Staphylococcus aureus)    hx of 2012- left upper leg - treated with Vancomycin   . Pain    CHRONIC NECK PAIN - DDD CERVICOTHORACIC; HX OF CERVICA FUSION - PT STATES 2 AREAS HAVE NOT FUSED AND SHE WILL NEED FURTHER SURGERY IN THE FUTURE  .  Pneumonia    ABOUT 6 YRS AGO  . PONV (postoperative nausea and vomiting)   . Scoliosis     Patient Active Problem List   Diagnosis Date Noted  . Expected blood loss anemia 02/24/2014  . Overweight (BMI 25.0-29.9) 01/13/2014  . S/P right THA, 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    Past Surgical History:  Procedure Laterality Date  . ABDOMINAL HYSTERECTOMY    . CERVICAL FUSION  2011  . TOTAL HIP ARTHROPLASTY Left 01/12/2014   Procedure: LEFT TOTAL HIP ARTHROPLASTY ANTERIOR APPROACH;  Surgeon: Shelda Pal, MD;  Location: [facility] ORS;  Service: Orthopedics;  Laterality: Left;  . TOTAL HIP ARTHROPLASTY Right 02/23/2014   Procedure: RIGHT TOTAL HIP ARTHROPLASTY ANTERIOR APPROACH;  Surgeon: Shelda Pal, MD;  Location: [facility] ORS;  Service: Orthopedics;  Laterality: Right;  . TUBAL LIGATION      OB History    No data available       Home Medications    Prior to Admission medications   Medication Sig Start Date End Date Taking? Authorizing Provider  amphetamine-dextroamphetamine (ADDERALL) 20 MG tablet Take 20-40 mg by mouth 2 (two) times daily. 2 tablets in the morning and 1 in the afternoon    [provider]  aspirin 325 MG EC tablet Take 1 tablet (325 mg total) by mouth 2 (two) times daily. 02/25/14   Lanney Gins, PA-C  buPROPion [facility]  XL) 300 MG 24 hr tablet Take 300 mg by mouth every morning.    [provider]  Cholecalciferol (VITAMIN D-3) 5000 UNITS TABS Take 5,000 Units by mouth daily.    [provider]  diazepam (VALIUM) 5 MG tablet Take 5 mg by mouth every 8 (eight) hours as needed for muscle spasms.    [provider]  docusate sodium 100 MG CAPS Take 100 mg by mouth 2 (two) times daily. 02/24/14   Lanney Gins, PA-C  doxepin (SINEQUAN) 50 MG capsule Take 50 mg by mouth at bedtime.     [provider]  DULoxetine (CYMBALTA) 60 MG capsule Take 60 mg by mouth every morning.    [provider]  estradiol (ESTRACE) 2 MG tablet Take 2 mg  by mouth daily.    [provider]  ferrous sulfate 325 (65 FE) MG tablet Take 325 mg by mouth 3 (three) times daily after meals. Patient stopped on 02/08/2014 01/14/14   Lanney Gins, PA-C  fexofenadine (ALLEGRA) 60 MG tablet Take 60 mg by mouth daily as needed for allergies or rhinitis.    [provider]  gabapentin (NEURONTIN) 300 MG capsule Take 300-600 mg by mouth 2 (two) times daily. 1 in the morning and 2 at night    [provider]  Glucosamine-Chondroitin (GLUCOSAMINE CHONDR COMPLEX PO) Take 1 tablet by mouth 2 (two) times daily.    [provider]  HYDROcodone-acetaminophen (NORCO) 7.5-325 MG per tablet Take 1-2 tablets by mouth every 4 (four) hours as needed for moderate pain. 02/25/14   Lanney Gins, PA-C  levothyroxine (SYNTHROID, LEVOTHROID) 50 MCG tablet Take 50 mcg by mouth every morning.    [provider]  methocarbamol (ROBAXIN) 500 MG tablet Take 1 tablet (500 mg total) by mouth every 6 (six) hours as needed for muscle spasms. 02/24/14   Lanney Gins, PA-C  metoprolol succinate (TOPROL-XL) 50 MG 24 hr tablet Take 50 mg by mouth at bedtime. Take with or immediately following a meal.    [provider]  montelukast (SINGULAIR) 10 MG tablet Take 10 mg by mouth every morning.     [provider]  Omega-3 Fatty Acids (SUPER OMEGA 3 PO) Take 2 capsules by mouth 2 (two) times daily.    [provider]  pantoprazole (PROTONIX) 40 MG tablet Take 40 mg by mouth at bedtime.     [provider]  polyethylene glycol (MIRALAX / GLYCOLAX) packet Take 17 g by mouth 2 (two) times daily. 02/24/14   Lanney Gins, PA-C  ramipril (ALTACE) 5 MG capsule Take 5 mg by mouth at bedtime.    [provider]  rizatriptan (MAXALT-MLT) 10 MG disintegrating tablet Take 10 mg by mouth every 2 (two) hours as needed for migraine. May repeat in 2 hours if needed    [provider]    Family History No family  history on file.  Social History Social History  Substance Use Topics  . Smoking status: Former Smoker    Packs/day: 1.00    Years: 10.00    Types: Cigarettes  . Smokeless tobacco: Never Used  . Alcohol use No     Comment: QUIT SMOKING  ABOUT 6 YRS AGO     Allergies   Mupirocin and Other   Review of Systems Review of Systems  Constitutional: Negative for fatigue.  HENT: Negative for tinnitus.   Eyes: Positive for photophobia. Negative for pain and visual disturbance.  Respiratory: Negative for shortness of breath.  Cardiovascular: Negative for chest pain.  Gastrointestinal: Positive for nausea and vomiting.  Musculoskeletal: Positive for neck pain. Negative for back pain and gait problem.  Skin: Negative for wound.  Neurological: Positive for headaches. Negative for dizziness, weakness, light-headedness and numbness.  Psychiatric/Behavioral: Negative for confusion and decreased concentration.     Physical Exam Updated Vital Signs BP (!) 194/96 (BP Location: Right Arm)   Pulse 84   Temp 97.9 F (36.6 C) (Oral)   Resp 18   SpO2 100%   Physical Exam  Constitutional: She is oriented to person, place, and time. She appears well-developed and well-nourished.  HENT:  Head: Normocephalic and atraumatic.  Right Ear: Tympanic membrane, external ear and ear canal normal.  Left Ear: Tympanic membrane, external ear and ear canal normal.  Nose: Nose normal.  Mouth/Throat: Uvula is midline, oropharynx is clear and moist and mucous membranes are normal.  Eyes: Pupils are equal, round, and reactive to light. Conjunctivae, EOM and lids are normal. Right eye exhibits no nystagmus. Left eye exhibits no nystagmus.  Neck: Normal range of motion. Neck supple.  No meningismus.  Cardiovascular: Normal rate and regular rhythm.   Pulmonary/Chest: Effort normal and breath sounds normal.  Abdominal: Soft. There is no tenderness.  Musculoskeletal: Normal range of motion.       Cervical  back: She exhibits normal range of motion, no tenderness and no bony tenderness.  Neurological: She is alert and oriented to person, place, and time. She has normal strength and normal reflexes. No cranial nerve deficit or sensory deficit. She displays a negative Romberg sign. Coordination and gait normal. GCS eye subscore is 4. GCS verbal subscore is 5. GCS motor subscore is 6.  5 out of 5 strength in upper and lower extremities.   Skin: Skin is warm and dry.  Psychiatric: She has a normal mood and affect.  Nursing note and vitals reviewed.    ED Treatments / Results   Procedures Procedures (including critical care time)  Medications Ordered in ED Medications  sodium chloride 0.9 % bolus 500 mL (500 mLs Intravenous New Bag/Given 02/27/17 2042)  metoCLOPramide (REGLAN) injection 10 mg (10 mg Intravenous Given 02/27/17 2043)  diphenhydrAMINE (BENADRYL) injection 25 mg (25 mg Intravenous Given 02/27/17 2043)  dexamethasone (DECADRON) injection 10 mg (10 mg Intravenous Given 02/27/17 2043)     Initial Impression / Assessment and Plan / ED Course  I have reviewed the triage vital signs and the nursing notes.  Pertinent labs & imaging results that were available during my care of the patient were reviewed by me and considered in my medical decision making (see chart for details).     Patient seen and examined. Work-up initiated. Medications ordered.   Vital signs reviewed and are as follows: BP (!) 194/96 (BP Location: Right Arm)   Pulse 84   Temp 97.9 F (36.6 C) (Oral)   Resp 18   SpO2 100%   Pending imaging and reassessment of symptoms.   Handoff to [employer] at shift change.   Plan to f/u on imaging, reassess. Consider toradol if pain not improved and imaging negative.   Final Clinical Impressions(s) / ED Diagnoses   Final diagnoses:  Acute nonintractable headache, unspecified headache type   Pending completion of workup.   New Prescriptions New Prescriptions   No  medications on file     Renne Crigler, Cordelia Poche 02/27/17 2101    Alvira Monday, MD 02/28/17 1332

## 2017-02-27 NOTE — Discharge Instructions (Signed)
Please read and follow all provided instructions.  Your diagnoses today include:  1. Acute nonintractable headache, unspecified headache type     Tests performed today include:  CT of your head and cervical spine  Vital signs. See below for your results today.   Medications:  In the Emergency Department you received:  Reglan - antinausea/headache medication  Benadryl - antihistamine to counteract potential side effects of reglan  Decadron - steroid medication to help prevent recurrent headache  Take any prescribed medications only as directed.  Additional information:  Follow any educational materials contained in this packet.  You are having a headache. No specific cause was found today for your headache. It may have been a migraine or other cause of headache. Stress, anxiety, fatigue, and depression are common triggers for headaches.   Your headache today does not appear to be life-threatening or require hospitalization, but often the exact cause of headaches is not determined in the emergency department. Therefore, follow-up with your doctor is very important to find out what may have caused your headache and whether or not you need any further diagnostic testing or treatment.   Sometimes headaches can appear benign (not harmful), but then more serious symptoms can develop which should prompt an immediate re-evaluation by your doctor or the emergency department.  BE VERY CAREFUL not to take multiple medicines containing Tylenol (also called acetaminophen). Doing so can lead to an overdose which can damage your liver and cause liver failure and possibly death.   Follow-up instructions: Please follow-up with your primary care provider in the next 3 days for further evaluation of your symptoms.   Return instructions:   Please return to the Emergency Department if you experience worsening symptoms.  Return if the medications do not resolve your headache, if it recurs, or if  you have multiple episodes of vomiting or cannot keep down fluids.  Return if you have a change from the usual headache.  RETURN IMMEDIATELY IF you:  Develop a sudden, severe headache  Develop confusion or become poorly responsive or faint  Develop a fever above 100.63F or problem breathing  Have a change in speech, vision, swallowing, or understanding  Develop new weakness, numbness, tingling, incoordination in your arms or legs  Have a seizure  Please return if you have any other emergent concerns.  Additional Information: Your CT scan of your head is normal. Your hardware in your cervical spine is stable. You have some arthritic changes in your spine from C7 to T1. Discuss these results with your spine specialist or your primary care.  Your vital signs today were: BP (!) 194/96 (BP Location: Right Arm)    Pulse 84    Temp 97.9 F (36.6 C) (Oral)    Resp 18    SpO2 100%  If your blood pressure (BP) was elevated above 135/85 this visit, please have this repeated by your doctor within one month. --------------

## 2017-03-12 DIAGNOSIS — Z01419 Encounter for gynecological examination (general) (routine) without abnormal findings: Secondary | ICD-10-CM | POA: Diagnosis not present

## 2017-03-12 DIAGNOSIS — Z6828 Body mass index (BMI) 28.0-28.9, adult: Secondary | ICD-10-CM | POA: Diagnosis not present

## 2017-03-12 DIAGNOSIS — Z1231 Encounter for screening mammogram for malignant neoplasm of breast: Secondary | ICD-10-CM | POA: Diagnosis not present

## 2017-04-02 DIAGNOSIS — Z23 Encounter for immunization: Secondary | ICD-10-CM | POA: Diagnosis not present

## 2017-04-02 DIAGNOSIS — E781 Pure hyperglyceridemia: Secondary | ICD-10-CM | POA: Diagnosis not present

## 2017-04-02 DIAGNOSIS — Z1331 Encounter for screening for depression: Secondary | ICD-10-CM | POA: Diagnosis not present

## 2017-04-02 DIAGNOSIS — I10 Essential (primary) hypertension: Secondary | ICD-10-CM | POA: Diagnosis not present

## 2017-06-12 DIAGNOSIS — Z1212 Encounter for screening for malignant neoplasm of rectum: Secondary | ICD-10-CM | POA: Diagnosis not present

## 2017-06-12 DIAGNOSIS — Z1211 Encounter for screening for malignant neoplasm of colon: Secondary | ICD-10-CM | POA: Diagnosis not present

## 2017-07-31 DIAGNOSIS — J019 Acute sinusitis, unspecified: Secondary | ICD-10-CM | POA: Diagnosis not present

## 2017-07-31 DIAGNOSIS — Z6828 Body mass index (BMI) 28.0-28.9, adult: Secondary | ICD-10-CM | POA: Diagnosis not present

## 2017-08-18 DIAGNOSIS — J189 Pneumonia, unspecified organism: Secondary | ICD-10-CM | POA: Diagnosis not present

## 2017-08-26 DIAGNOSIS — J189 Pneumonia, unspecified organism: Secondary | ICD-10-CM | POA: Diagnosis not present

## 2017-10-01 DIAGNOSIS — M797 Fibromyalgia: Secondary | ICD-10-CM | POA: Diagnosis not present

## 2017-10-01 DIAGNOSIS — Z79899 Other long term (current) drug therapy: Secondary | ICD-10-CM | POA: Diagnosis not present

## 2017-10-01 DIAGNOSIS — I10 Essential (primary) hypertension: Secondary | ICD-10-CM | POA: Diagnosis not present

## 2017-10-01 DIAGNOSIS — E039 Hypothyroidism, unspecified: Secondary | ICD-10-CM | POA: Diagnosis not present

## 2017-10-01 DIAGNOSIS — F329 Major depressive disorder, single episode, unspecified: Secondary | ICD-10-CM | POA: Diagnosis not present

## 2017-10-01 DIAGNOSIS — E781 Pure hyperglyceridemia: Secondary | ICD-10-CM | POA: Diagnosis not present

## 2018-01-30 DIAGNOSIS — R197 Diarrhea, unspecified: Secondary | ICD-10-CM | POA: Diagnosis not present

## 2018-01-30 DIAGNOSIS — Z6826 Body mass index (BMI) 26.0-26.9, adult: Secondary | ICD-10-CM | POA: Diagnosis not present

## 2018-01-30 DIAGNOSIS — A084 Viral intestinal infection, unspecified: Secondary | ICD-10-CM | POA: Diagnosis not present

## 2018-03-18 DIAGNOSIS — E7800 Pure hypercholesterolemia, unspecified: Secondary | ICD-10-CM | POA: Insufficient documentation

## 2018-03-18 DIAGNOSIS — G43909 Migraine, unspecified, not intractable, without status migrainosus: Secondary | ICD-10-CM | POA: Insufficient documentation

## 2018-03-18 DIAGNOSIS — M419 Scoliosis, unspecified: Secondary | ICD-10-CM | POA: Diagnosis not present

## 2018-03-18 DIAGNOSIS — M545 Low back pain: Secondary | ICD-10-CM | POA: Diagnosis not present

## 2018-03-18 DIAGNOSIS — I10 Essential (primary) hypertension: Secondary | ICD-10-CM | POA: Insufficient documentation

## 2018-03-18 HISTORY — DX: Migraine, unspecified, not intractable, without status migrainosus: G43.909

## 2018-03-18 HISTORY — DX: Essential (primary) hypertension: I10

## 2018-03-18 HISTORY — DX: Pure hypercholesterolemia, unspecified: E78.00

## 2018-04-01 DIAGNOSIS — M5416 Radiculopathy, lumbar region: Secondary | ICD-10-CM | POA: Insufficient documentation

## 2018-04-01 HISTORY — DX: Radiculopathy, lumbar region: M54.16

## 2018-04-08 DIAGNOSIS — E781 Pure hyperglyceridemia: Secondary | ICD-10-CM | POA: Diagnosis not present

## 2018-04-08 DIAGNOSIS — I10 Essential (primary) hypertension: Secondary | ICD-10-CM | POA: Diagnosis not present

## 2018-04-08 DIAGNOSIS — Z23 Encounter for immunization: Secondary | ICD-10-CM | POA: Diagnosis not present

## 2018-04-08 DIAGNOSIS — F329 Major depressive disorder, single episode, unspecified: Secondary | ICD-10-CM | POA: Diagnosis not present

## 2018-04-08 DIAGNOSIS — M5416 Radiculopathy, lumbar region: Secondary | ICD-10-CM | POA: Diagnosis not present

## 2018-04-08 DIAGNOSIS — Z79899 Other long term (current) drug therapy: Secondary | ICD-10-CM | POA: Diagnosis not present

## 2018-04-08 DIAGNOSIS — Z1331 Encounter for screening for depression: Secondary | ICD-10-CM | POA: Diagnosis not present

## 2018-04-09 DIAGNOSIS — M5416 Radiculopathy, lumbar region: Secondary | ICD-10-CM | POA: Diagnosis not present

## 2018-04-15 DIAGNOSIS — M5416 Radiculopathy, lumbar region: Secondary | ICD-10-CM | POA: Diagnosis not present

## 2018-04-22 DIAGNOSIS — M5416 Radiculopathy, lumbar region: Secondary | ICD-10-CM | POA: Diagnosis not present

## 2018-04-28 DIAGNOSIS — Z01419 Encounter for gynecological examination (general) (routine) without abnormal findings: Secondary | ICD-10-CM | POA: Diagnosis not present

## 2018-04-28 DIAGNOSIS — Z6826 Body mass index (BMI) 26.0-26.9, adult: Secondary | ICD-10-CM | POA: Diagnosis not present

## 2018-04-28 DIAGNOSIS — Z1231 Encounter for screening mammogram for malignant neoplasm of breast: Secondary | ICD-10-CM | POA: Diagnosis not present

## 2018-04-28 DIAGNOSIS — Z1272 Encounter for screening for malignant neoplasm of vagina: Secondary | ICD-10-CM | POA: Diagnosis not present

## 2018-04-29 DIAGNOSIS — M5416 Radiculopathy, lumbar region: Secondary | ICD-10-CM | POA: Diagnosis not present

## 2018-04-30 ENCOUNTER — Other Ambulatory Visit: Payer: Self-pay | Admitting: Obstetrics and Gynecology

## 2018-04-30 DIAGNOSIS — R928 Other abnormal and inconclusive findings on diagnostic imaging of breast: Secondary | ICD-10-CM

## 2018-05-05 DIAGNOSIS — J208 Acute bronchitis due to other specified organisms: Secondary | ICD-10-CM | POA: Diagnosis not present

## 2018-05-05 DIAGNOSIS — Z6826 Body mass index (BMI) 26.0-26.9, adult: Secondary | ICD-10-CM | POA: Diagnosis not present

## 2018-05-07 ENCOUNTER — Ambulatory Visit
Admission: RE | Admit: 2018-05-07 | Discharge: 2018-05-07 | Disposition: A | Discharge status: Home / Self Care | Payer: BLUE CROSS/BLUE SHIELD | Source: Ambulatory Visit | Attending: Obstetrics and Gynecology | Admitting: Obstetrics and Gynecology

## 2018-05-07 ENCOUNTER — Ambulatory Visit: Payer: BLUE CROSS/BLUE SHIELD

## 2018-05-07 DIAGNOSIS — R922 Inconclusive mammogram: Secondary | ICD-10-CM | POA: Diagnosis not present

## 2018-05-07 DIAGNOSIS — R928 Other abnormal and inconclusive findings on diagnostic imaging of breast: Secondary | ICD-10-CM

## 2018-05-09 DIAGNOSIS — I10 Essential (primary) hypertension: Secondary | ICD-10-CM | POA: Diagnosis not present

## 2018-05-09 DIAGNOSIS — F419 Anxiety disorder, unspecified: Secondary | ICD-10-CM | POA: Diagnosis not present

## 2018-05-09 DIAGNOSIS — Z6826 Body mass index (BMI) 26.0-26.9, adult: Secondary | ICD-10-CM | POA: Diagnosis not present

## 2018-05-13 DIAGNOSIS — M5416 Radiculopathy, lumbar region: Secondary | ICD-10-CM | POA: Diagnosis not present

## 2018-05-26 DIAGNOSIS — M5416 Radiculopathy, lumbar region: Secondary | ICD-10-CM | POA: Diagnosis not present

## 2018-06-03 DIAGNOSIS — M5416 Radiculopathy, lumbar region: Secondary | ICD-10-CM | POA: Diagnosis not present

## 2018-06-10 DIAGNOSIS — M5416 Radiculopathy, lumbar region: Secondary | ICD-10-CM | POA: Diagnosis not present

## 2018-08-16 DIAGNOSIS — R51 Headache: Secondary | ICD-10-CM | POA: Diagnosis not present

## 2018-08-16 DIAGNOSIS — K591 Functional diarrhea: Secondary | ICD-10-CM | POA: Diagnosis not present

## 2018-08-19 DIAGNOSIS — I10 Essential (primary) hypertension: Secondary | ICD-10-CM | POA: Diagnosis not present

## 2018-08-19 DIAGNOSIS — R42 Dizziness and giddiness: Secondary | ICD-10-CM | POA: Diagnosis not present

## 2018-08-19 DIAGNOSIS — Z6826 Body mass index (BMI) 26.0-26.9, adult: Secondary | ICD-10-CM | POA: Diagnosis not present

## 2018-08-19 DIAGNOSIS — A084 Viral intestinal infection, unspecified: Secondary | ICD-10-CM | POA: Diagnosis not present

## 2018-08-26 DIAGNOSIS — R42 Dizziness and giddiness: Secondary | ICD-10-CM | POA: Diagnosis not present

## 2018-08-29 DIAGNOSIS — H9192 Unspecified hearing loss, left ear: Secondary | ICD-10-CM | POA: Diagnosis not present

## 2018-08-29 DIAGNOSIS — I10 Essential (primary) hypertension: Secondary | ICD-10-CM | POA: Diagnosis not present

## 2018-08-29 DIAGNOSIS — H6120 Impacted cerumen, unspecified ear: Secondary | ICD-10-CM | POA: Diagnosis not present

## 2018-08-29 DIAGNOSIS — F988 Other specified behavioral and emotional disorders with onset usually occurring in childhood and adolescence: Secondary | ICD-10-CM | POA: Diagnosis not present

## 2018-11-20 DIAGNOSIS — M797 Fibromyalgia: Secondary | ICD-10-CM | POA: Diagnosis not present

## 2018-11-20 DIAGNOSIS — E781 Pure hyperglyceridemia: Secondary | ICD-10-CM | POA: Diagnosis not present

## 2018-11-20 DIAGNOSIS — E039 Hypothyroidism, unspecified: Secondary | ICD-10-CM | POA: Diagnosis not present

## 2018-11-20 DIAGNOSIS — I10 Essential (primary) hypertension: Secondary | ICD-10-CM | POA: Diagnosis not present

## 2018-12-03 IMAGING — CT CT CERVICAL SPINE W/O CM
4 of 7 series · 12 of 33 positions shown, 13 images · non-contrast
Comparison: None.

CLINICAL DATA: Severe headache for several days. Neck pain after
fusion.

EXAM:
CT HEAD WITHOUT CONTRAST
CT CERVICAL SPINE WITHOUT CONTRAST
TECHNIQUE: Multidetector CT imaging of the head and cervical spine was
performed following the standard protocol without intravenous
contrast. Multiplanar CT image reconstructions of the cervical spine
were also generated.

[Series 9: c_spine 2.0 st · axial · 0.30mm/px · z∈[-290,-158]mm · 4 of 112 slices shown, 5 images]
[im 23/112  soft-tissue]
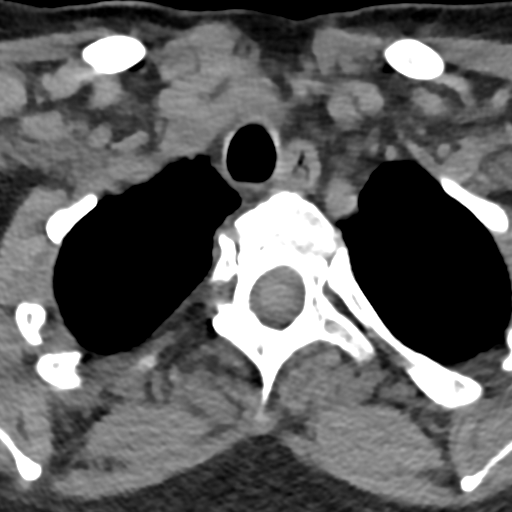
[im 23/112  bone]
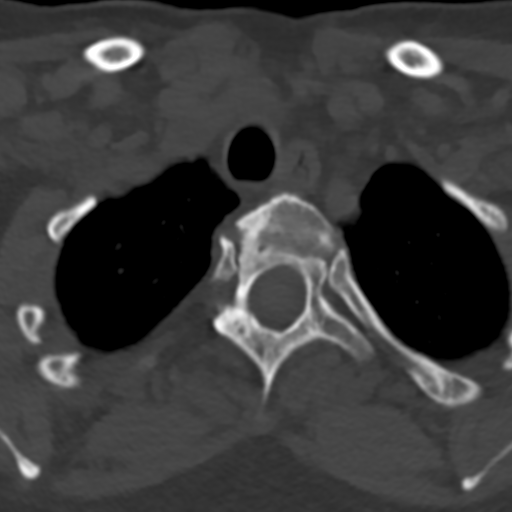
[im 45/112  bone]
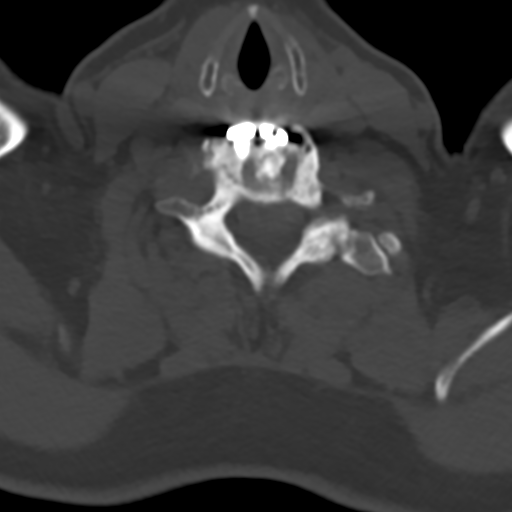
[im 67/112  bone]
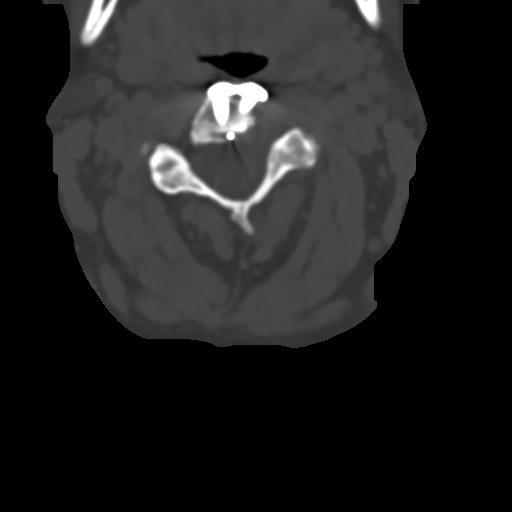
[im 89/112  bone]
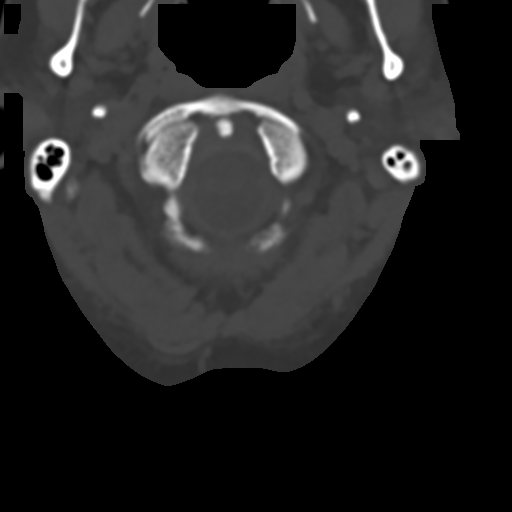

[Series 12: coronal bone · coronal · 0.23mm/px · 1 of 61 slices shown]
[im 31/61  bone]
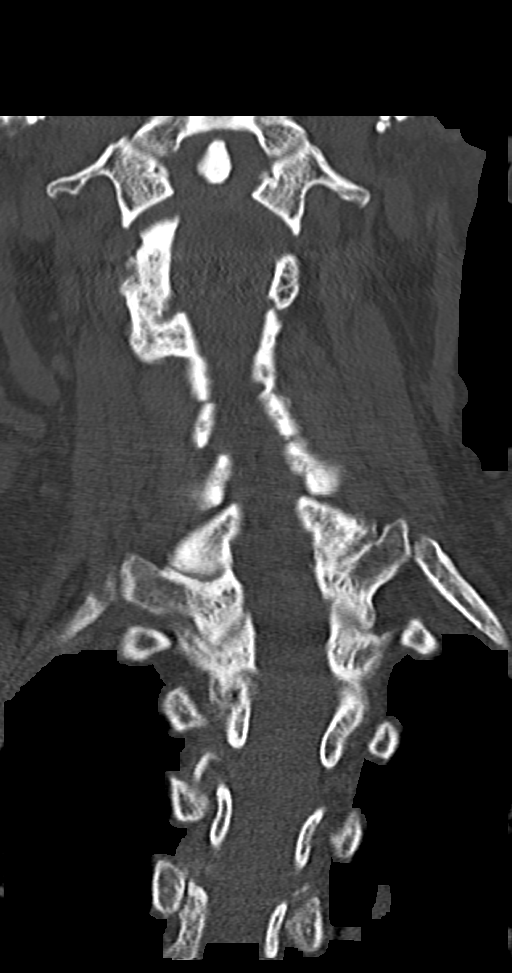

[Series 13: sagittal bone · sagittal · 0.23mm/px · 5 of 61 slices shown]
[im 11/61  bone]
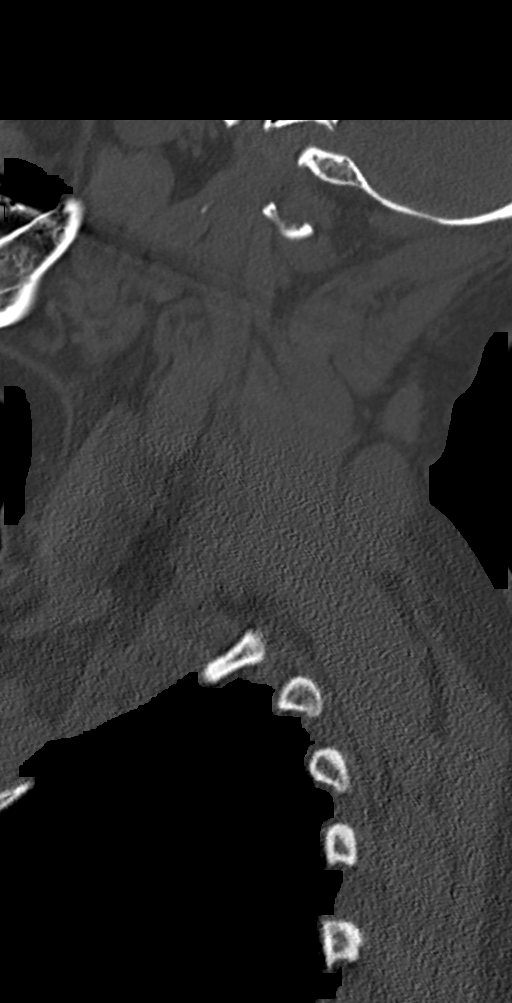
[im 21/61  bone]
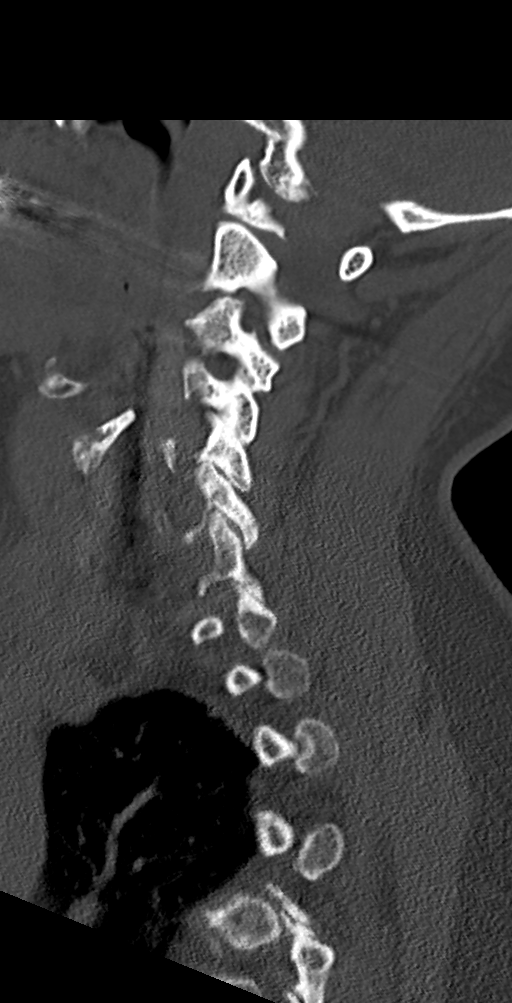
[im 31/61  bone]
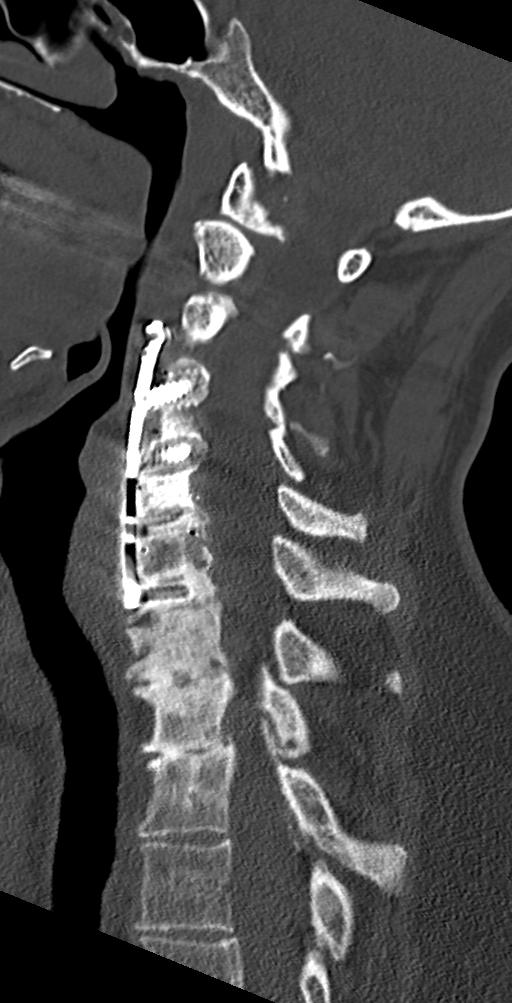
[im 41/61  bone]
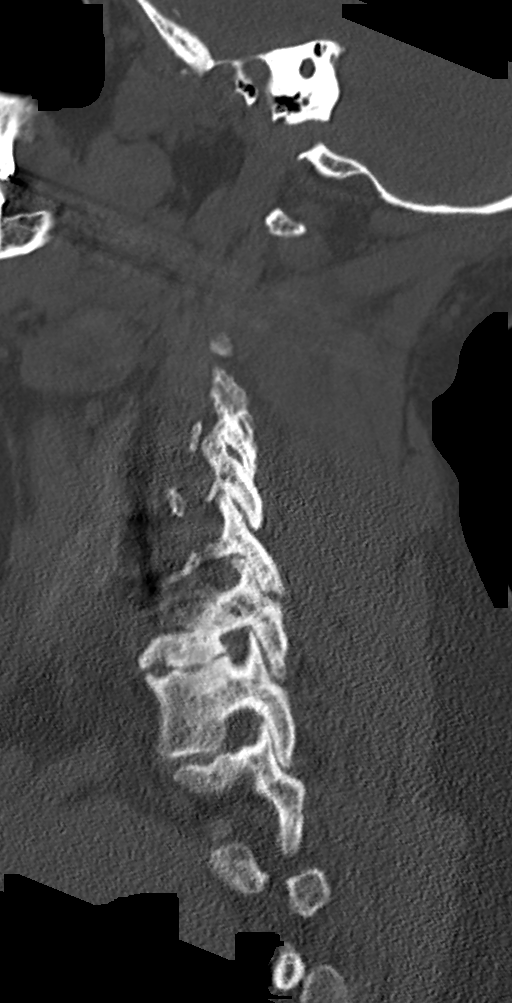
[im 51/61  bone]
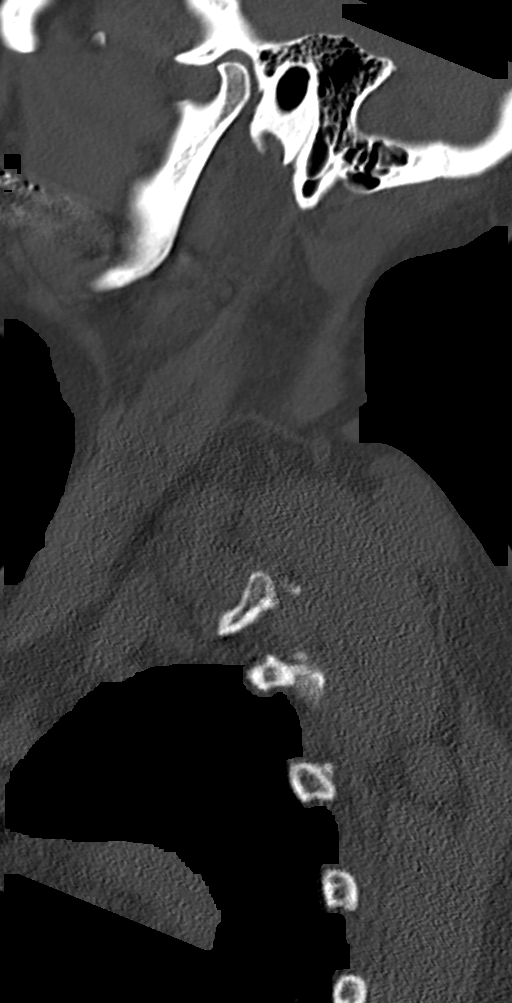

[Series 15: orthogonal axial st · axial · 0.21mm/px · z∈[-307,-283]mm · 2 of 110 slices shown]
[im 22/110  bone]
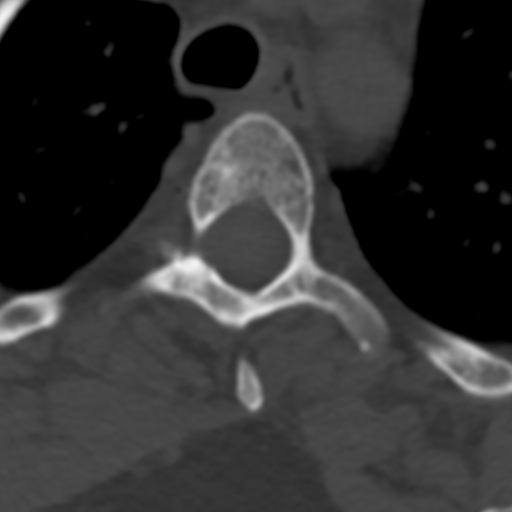
[im 44/110  bone]
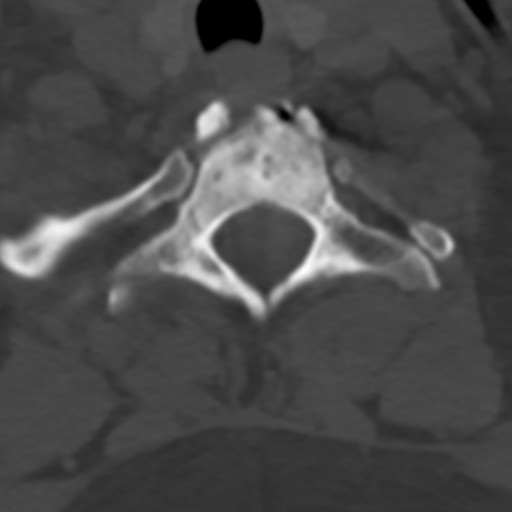

[12 of 33 positions shown; findings below may reference images not displayed]

FINDINGS: CT HEAD FINDINGS

BRAIN: No intraparenchymal hemorrhage, mass effect nor midline
shift. The ventricles and sulci are normal for age. Patchy
supratentorial white matter hypodensities within normal range for
patient's age, though non-specific are most compatible with chronic
small vessel ischemic disease. No acute large vascular territory
infarcts. No abnormal extra-axial fluid collections. Basal cisterns
are patent.

VASCULAR: Trace calcific atherosclerosis of the carotid siphons.

SKULL: No skull fracture. No significant scalp soft tissue swelling.

SINUSES/ORBITS: The mastoid air-cells and included paranasal sinuses
are well-aerated.The included ocular globes and orbital contents are
non-suspicious.

OTHER: None.

CT CERVICAL SPINE FINDINGS

ALIGNMENT: Straightened lordosis. Vertebral bodies in alignment.

SKULL BASE AND VERTEBRAE: Cervical vertebral bodies and posterior
elements are intact. Status post C3 through C7 ACDF, with
arthrodesis. Severe C7-T1, T1-2 and T2-3 disc height loss with
endplate sclerosis and marginal spurring compatible with
degenerative discs. Severe cervicothoracic levoscoliosis.

SOFT TISSUES AND SPINAL CANAL: Normal.

DISC LEVELS: No significant osseous canal stenosis. Moderate RIGHT,
mild-to-moderate LEFT neural foraminal narrowing most compatible
with adjacent segment disease.

UPPER CHEST: Lung apices are clear.

OTHER: None.
IMPRESSION: CT HEAD:

1. Negative noncontrast CT HEAD.
CT CERVICAL SPINE:

1. No acute fracture or malalignment.
2. Status post C3 through C7 ACDF with arthrodesis. Severe C7-T1
through T1-2 degenerative discs associated with levoscoliosis.

## 2018-12-15 DIAGNOSIS — H9042 Sensorineural hearing loss, unilateral, left ear, with unrestricted hearing on the contralateral side: Secondary | ICD-10-CM | POA: Diagnosis not present

## 2018-12-15 DIAGNOSIS — H9313 Tinnitus, bilateral: Secondary | ICD-10-CM | POA: Diagnosis not present

## 2018-12-15 DIAGNOSIS — H9193 Unspecified hearing loss, bilateral: Secondary | ICD-10-CM | POA: Diagnosis not present

## 2018-12-15 DIAGNOSIS — Z822 Family history of deafness and hearing loss: Secondary | ICD-10-CM | POA: Diagnosis not present

## 2018-12-19 DIAGNOSIS — F988 Other specified behavioral and emotional disorders with onset usually occurring in childhood and adolescence: Secondary | ICD-10-CM | POA: Diagnosis not present

## 2018-12-19 DIAGNOSIS — I10 Essential (primary) hypertension: Secondary | ICD-10-CM | POA: Diagnosis not present

## 2018-12-19 DIAGNOSIS — Z6826 Body mass index (BMI) 26.0-26.9, adult: Secondary | ICD-10-CM | POA: Diagnosis not present

## 2018-12-29 DIAGNOSIS — H903 Sensorineural hearing loss, bilateral: Secondary | ICD-10-CM | POA: Diagnosis not present

## 2019-01-16 DIAGNOSIS — Z96643 Presence of artificial hip joint, bilateral: Secondary | ICD-10-CM | POA: Diagnosis not present

## 2019-01-16 DIAGNOSIS — M4180 Other forms of scoliosis, site unspecified: Secondary | ICD-10-CM | POA: Diagnosis not present

## 2019-01-16 DIAGNOSIS — Z471 Aftercare following joint replacement surgery: Secondary | ICD-10-CM | POA: Diagnosis not present

## 2019-01-16 DIAGNOSIS — M545 Low back pain: Secondary | ICD-10-CM | POA: Diagnosis not present

## 2019-01-27 DIAGNOSIS — M545 Low back pain: Secondary | ICD-10-CM | POA: Diagnosis not present

## 2019-01-29 DIAGNOSIS — M5416 Radiculopathy, lumbar region: Secondary | ICD-10-CM | POA: Diagnosis not present

## 2019-02-19 DIAGNOSIS — F988 Other specified behavioral and emotional disorders with onset usually occurring in childhood and adolescence: Secondary | ICD-10-CM | POA: Diagnosis not present

## 2019-02-19 DIAGNOSIS — F419 Anxiety disorder, unspecified: Secondary | ICD-10-CM | POA: Diagnosis not present

## 2019-02-19 DIAGNOSIS — Z6826 Body mass index (BMI) 26.0-26.9, adult: Secondary | ICD-10-CM | POA: Diagnosis not present

## 2019-02-19 DIAGNOSIS — I10 Essential (primary) hypertension: Secondary | ICD-10-CM | POA: Diagnosis not present

## 2019-02-24 DIAGNOSIS — M5416 Radiculopathy, lumbar region: Secondary | ICD-10-CM | POA: Diagnosis not present

## 2019-05-05 DIAGNOSIS — Z8616 Personal history of COVID-19: Secondary | ICD-10-CM

## 2019-05-05 HISTORY — DX: Personal history of COVID-19: Z86.16

## 2019-05-15 DIAGNOSIS — Z20828 Contact with and (suspected) exposure to other viral communicable diseases: Secondary | ICD-10-CM | POA: Diagnosis not present

## 2019-05-15 DIAGNOSIS — R6889 Other general symptoms and signs: Secondary | ICD-10-CM | POA: Diagnosis not present

## 2019-05-15 DIAGNOSIS — J101 Influenza due to other identified influenza virus with other respiratory manifestations: Secondary | ICD-10-CM | POA: Diagnosis not present

## 2019-05-25 ENCOUNTER — Inpatient Hospital Stay (HOSPITAL_COMMUNITY)
Admission: EM | Admit: 2019-05-25 | Discharge: 2019-05-30 | DRG: 177 | Disposition: A | Discharge status: Home / Self Care | Payer: BC Managed Care – PPO | Attending: Internal Medicine | Admitting: Internal Medicine

## 2019-05-25 ENCOUNTER — Emergency Department (HOSPITAL_COMMUNITY): Payer: BC Managed Care – PPO

## 2019-05-25 ENCOUNTER — Other Ambulatory Visit: Payer: Self-pay

## 2019-05-25 ENCOUNTER — Encounter (HOSPITAL_COMMUNITY): Payer: Self-pay | Admitting: Emergency Medicine

## 2019-05-25 DIAGNOSIS — R0902 Hypoxemia: Secondary | ICD-10-CM

## 2019-05-25 DIAGNOSIS — Z23 Encounter for immunization: Secondary | ICD-10-CM

## 2019-05-25 DIAGNOSIS — R112 Nausea with vomiting, unspecified: Secondary | ICD-10-CM | POA: Diagnosis present

## 2019-05-25 DIAGNOSIS — J1282 Pneumonia due to coronavirus disease 2019: Secondary | ICD-10-CM

## 2019-05-25 DIAGNOSIS — E871 Hypo-osmolality and hyponatremia: Secondary | ICD-10-CM | POA: Diagnosis present

## 2019-05-25 DIAGNOSIS — K219 Gastro-esophageal reflux disease without esophagitis: Secondary | ICD-10-CM | POA: Diagnosis present

## 2019-05-25 DIAGNOSIS — F329 Major depressive disorder, single episode, unspecified: Secondary | ICD-10-CM | POA: Diagnosis present

## 2019-05-25 DIAGNOSIS — Z96642 Presence of left artificial hip joint: Secondary | ICD-10-CM | POA: Diagnosis not present

## 2019-05-25 DIAGNOSIS — J1289 Other viral pneumonia: Secondary | ICD-10-CM | POA: Diagnosis present

## 2019-05-25 DIAGNOSIS — M419 Scoliosis, unspecified: Secondary | ICD-10-CM

## 2019-05-25 DIAGNOSIS — J189 Pneumonia, unspecified organism: Secondary | ICD-10-CM | POA: Diagnosis not present

## 2019-05-25 DIAGNOSIS — U071 COVID-19: Principal | ICD-10-CM | POA: Diagnosis present

## 2019-05-25 DIAGNOSIS — G8929 Other chronic pain: Secondary | ICD-10-CM | POA: Diagnosis present

## 2019-05-25 DIAGNOSIS — Z7982 Long term (current) use of aspirin: Secondary | ICD-10-CM | POA: Diagnosis not present

## 2019-05-25 DIAGNOSIS — Z79890 Hormone replacement therapy (postmenopausal): Secondary | ICD-10-CM | POA: Diagnosis not present

## 2019-05-25 DIAGNOSIS — R000 Tachycardia, unspecified: Secondary | ICD-10-CM | POA: Diagnosis not present

## 2019-05-25 DIAGNOSIS — Z981 Arthrodesis status: Secondary | ICD-10-CM | POA: Diagnosis not present

## 2019-05-25 DIAGNOSIS — M797 Fibromyalgia: Secondary | ICD-10-CM

## 2019-05-25 DIAGNOSIS — E039 Hypothyroidism, unspecified: Secondary | ICD-10-CM

## 2019-05-25 DIAGNOSIS — F988 Other specified behavioral and emotional disorders with onset usually occurring in childhood and adolescence: Secondary | ICD-10-CM | POA: Diagnosis present

## 2019-05-25 DIAGNOSIS — J9601 Acute respiratory failure with hypoxia: Secondary | ICD-10-CM | POA: Diagnosis present

## 2019-05-25 DIAGNOSIS — Z87891 Personal history of nicotine dependence: Secondary | ICD-10-CM

## 2019-05-25 DIAGNOSIS — F419 Anxiety disorder, unspecified: Secondary | ICD-10-CM | POA: Diagnosis not present

## 2019-05-25 DIAGNOSIS — I10 Essential (primary) hypertension: Secondary | ICD-10-CM | POA: Diagnosis not present

## 2019-05-25 DIAGNOSIS — R079 Chest pain, unspecified: Secondary | ICD-10-CM | POA: Diagnosis not present

## 2019-05-25 DIAGNOSIS — F32A Depression, unspecified: Secondary | ICD-10-CM

## 2019-05-25 LAB — BASIC METABOLIC PANEL
Anion gap: 9 (ref 5–15)
BUN: 14 mg/dL (ref 6–20)
CO2: 24 mmol/L (ref 22–32)
Calcium: 8.3 mg/dL — ABNORMAL LOW (ref 8.9–10.3)
Chloride: 97 mmol/L — ABNORMAL LOW (ref 98–111)
Creatinine, Ser: 1.16 mg/dL — ABNORMAL HIGH (ref 0.44–1.00)
GFR calc Af Amer: 59 mL/min — ABNORMAL LOW (ref >60)
GFR calc non Af Amer: 51 mL/min — ABNORMAL LOW (ref >60)
Glucose, Bld: 105 mg/dL — ABNORMAL HIGH (ref 70–99)
Potassium: 5 mmol/L (ref 3.5–5.1)
Sodium: 130 mmol/L — ABNORMAL LOW (ref 135–145)

## 2019-05-25 LAB — TROPONIN I (HIGH SENSITIVITY)
Troponin I (High Sensitivity): 6 ng/L (ref <18)
Troponin I (High Sensitivity): 7 ng/L (ref <18)

## 2019-05-25 LAB — CBC
HCT: 34.1 % — ABNORMAL LOW (ref 36.0–46.0)
Hemoglobin: 11.5 g/dL — ABNORMAL LOW (ref 12.0–15.0)
MCH: 32.2 pg (ref 26.0–34.0)
MCHC: 33.7 g/dL (ref 30.0–36.0)
MCV: 95.5 fL (ref 80.0–100.0)
Platelets: 275 10*3/uL (ref 150–400)
RBC: 3.57 MIL/uL — ABNORMAL LOW (ref 3.87–5.11)
RDW: 11.5 % (ref 11.5–15.5)
WBC: 8.5 10*3/uL (ref 4.0–10.5)
nRBC: 0 % (ref 0.0–0.2)

## 2019-05-25 LAB — POC SARS CORONAVIRUS 2 AG -  ED: SARS Coronavirus 2 Ag: POSITIVE — AB

## 2019-05-25 LAB — I-STAT BETA HCG BLOOD, ED (MC, WL, AP ONLY): I-stat hCG, quantitative: <5 m[IU]/mL (ref <5)

## 2019-05-25 MED ORDER — SODIUM CHLORIDE 0.9 % IV BOLUS
500.0000 mL | Freq: Once | INTRAVENOUS | Status: AC
  Administered 2019-05-25: 500 mL via INTRAVENOUS

## 2019-05-25 MED ORDER — SODIUM CHLORIDE 0.9% FLUSH: 3.0000 mL | Freq: Once | INTRAVENOUS | Status: DC

## 2019-05-25 MED ORDER — FENTANYL CITRATE (PF) 100 MCG/2ML IJ SOLN
50.0000 ug | Freq: Once | INTRAMUSCULAR | Status: AC
  Administered 2019-05-25: 50 ug via INTRAVENOUS
  Filled 2019-05-25: qty 2

## 2019-05-25 MED ORDER — ONDANSETRON HCL 4 MG/2ML IJ SOLN
4.0000 mg | Freq: Once | INTRAMUSCULAR | Status: AC
  Administered 2019-05-25: 4 mg via INTRAVENOUS
  Filled 2019-05-25: qty 2

## 2019-05-25 NOTE — ED Triage Notes (Signed)
Patient was diagnosed with influenza and Covid 10 days ago.  She states that she now has chest pain.  Patient with cough, she has been vomiting, has not been able to keep food down.

## 2019-05-25 NOTE — ED Provider Notes (Signed)
[facility] Provider Note   CSN: [id number] Arrival date & time: 05/25/19  1944     History Chief Complaint  Patient presents with  . Influenza  . Chest Pain  . Covid +    Stephanie Cobb is a 58 y.o. female.  Patient with a history of HTN, hypertriglyceridemia, ADD, GERD, fibromyalgia, presents with nausea, vomiting, severe chills without fever, painful breathing, diarrhea x 10 days. [place] in [place] 1 and swabs were done for influenza and COVID. She was positive for influenza B and COVID. Her current symptoms have persisted and worsened since diagnosis. She states she is unable to keep anything on her stomach. No hematemesis or bloody stools.    The history is provided by the patient. No language interpreter was used.  Influenza Presenting symptoms: diarrhea, myalgias, nausea and vomiting   Presenting symptoms: no fever   Associated symptoms: chills   Chest Pain Associated symptoms: abdominal pain (Epigastric), nausea, vomiting and weakness   Associated symptoms: no fever        Past Medical History:  Diagnosis Date  . ADD (attention deficit disorder)   . Anemia   . Anxiety   . Arthritis    OA / PAIN BOTH HIPS  . Depression   . Fibromyalgia   . GERD (gastroesophageal reflux disease)   . Headache(784.0)    MIGRAINES  . Heart palpitations    NUCLEAR STRESS TEST 12/14/13 [facility]- NO ISCHEMIA, EF 74%  . Hypertension   . Hypertriglyceridemia   . Hypothyroidism   . Insomnia   . Kidney insufficiency    PT STATES BUN SLIGHTLY ELEVATED - HER DOCTORS ARE WATCHING  . MRSA (methicillin resistant Staphylococcus aureus)    hx of 2012- left upper leg - treated with Vancomycin   . Pain    CHRONIC NECK PAIN - DDD CERVICOTHORACIC; HX OF CERVICA FUSION - PT STATES 2 AREAS HAVE NOT FUSED AND SHE WILL NEED FURTHER SURGERY IN THE FUTURE  . Pneumonia    ABOUT 6 YRS AGO  . PONV (postoperative  nausea and vomiting)   . Scoliosis     Patient Active Problem List   Diagnosis Date Noted  . Expected blood loss anemia 02/24/2014  . Overweight (BMI 25.0-29.9) 01/13/2014  . S/P right THA, 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    Past Surgical History:  Procedure Laterality Date  . ABDOMINAL HYSTERECTOMY    . CERVICAL FUSION  2011  . TOTAL HIP ARTHROPLASTY Left 01/12/2014   Procedure: LEFT TOTAL HIP ARTHROPLASTY ANTERIOR APPROACH;  Surgeon: Shelda PalMatthew D Olin, MD;  Location: [facility] ORS;  Service: Orthopedics;  Laterality: Left;  . TOTAL HIP ARTHROPLASTY Right 02/23/2014   Procedure: RIGHT TOTAL HIP ARTHROPLASTY ANTERIOR APPROACH;  Surgeon: Shelda PalMatthew D Olin, MD;  Location: [facility] ORS;  Service: Orthopedics;  Laterality: Right;  . TUBAL LIGATION       OB History   No obstetric history on file.     No family history on file.  Social History   Tobacco Use  . Smoking status: Former Smoker    Packs/day: 1.00    Years: 10.00    Pack years: 10.00    Types: Cigarettes  . Smokeless tobacco: Never Used  Substance Use Topics  . Alcohol use: No    Comment: QUIT SMOKING  ABOUT 6 YRS AGO  . Drug use: No    Home Medications Prior to Admission medications   Medication Sig Start Date End Date  Taking? Authorizing Provider  amphetamine-dextroamphetamine (ADDERALL) 20 MG tablet Take 20-40 mg by mouth 2 (two) times daily. 2 tablets in the morning and 1 in the afternoon    [provider]  aspirin 325 MG EC tablet Take 1 tablet (325 mg total) by mouth 2 (two) times daily. 02/25/14   Danae Orleans, PA-C  buPROPion (WELLBUTRIN XL) 300 MG 24 hr tablet Take 300 mg by mouth every morning.    [provider]  Cholecalciferol (VITAMIN D-3) 5000 UNITS TABS Take 5,000 Units by mouth daily.    [provider]  diazepam (VALIUM) 5 MG tablet Take 5 mg by mouth every 8 (eight) hours as needed for muscle spasms.    [provider]  docusate sodium 100 MG CAPS Take 100 mg by mouth 2 (two) times daily.  02/24/14   Danae Orleans, PA-C  doxepin (SINEQUAN) 50 MG capsule Take 50 mg by mouth at bedtime.     [provider]  DULoxetine (CYMBALTA) 60 MG capsule Take 60 mg by mouth every morning.    [provider]  estradiol (ESTRACE) 2 MG tablet Take 2 mg by mouth daily.    [provider]  ferrous sulfate 325 (65 FE) MG tablet Take 325 mg by mouth 3 (three) times daily after meals. Patient stopped on 02/08/2014 01/14/14   Danae Orleans, PA-C  fexofenadine (ALLEGRA) 60 MG tablet Take 60 mg by mouth daily as needed for allergies or rhinitis.    [provider]  gabapentin (NEURONTIN) 300 MG capsule Take 300-600 mg by mouth 2 (two) times daily. 1 in the morning and 2 at night    [provider]  Glucosamine-Chondroitin (GLUCOSAMINE CHONDR COMPLEX PO) Take 1 tablet by mouth 2 (two) times daily.    [provider]  HYDROcodone-acetaminophen (NORCO) 7.5-325 MG per tablet Take 1-2 tablets by mouth every 4 (four) hours as needed for moderate pain. 02/25/14   Danae Orleans, PA-C  levothyroxine (SYNTHROID, LEVOTHROID) 50 MCG tablet Take 50 mcg by mouth every morning.    [provider]  methocarbamol (ROBAXIN) 500 MG tablet Take 1 tablet (500 mg total) by mouth every 6 (six) hours as needed for muscle spasms. 02/24/14   Danae Orleans, PA-C  metoprolol succinate (TOPROL-XL) 50 MG 24 hr tablet Take 50 mg by mouth at bedtime. Take with or immediately following a meal.    [provider]  montelukast (SINGULAIR) 10 MG tablet Take 10 mg by mouth every morning.     [provider]  Omega-3 Fatty Acids (SUPER OMEGA 3 PO) Take 2 capsules by mouth 2 (two) times daily.    [provider]  pantoprazole (PROTONIX) 40 MG tablet Take 40 mg by mouth at bedtime.     [provider]  polyethylene glycol (MIRALAX / GLYCOLAX) packet Take 17 g by mouth 2 (two) times daily. 02/24/14   Danae Orleans, PA-C  ramipril (ALTACE) 5 MG  capsule Take 5 mg by mouth at bedtime.    [provider]  rizatriptan (MAXALT-MLT) 10 MG disintegrating tablet Take 10 mg by mouth every 2 (two) hours as needed for migraine. May repeat in 2 hours if needed    [provider]    Allergies    Mupirocin and Other  Review of Systems   Review of Systems  Constitutional: Positive for chills. Negative for fever.  HENT: Negative.   Respiratory: Negative.        No SOB but she reports that it hurts to  breathe.  Cardiovascular: Positive for chest pain (Sternal).  Gastrointestinal: Positive for abdominal pain (Epigastric), diarrhea, nausea and vomiting.  Genitourinary: Negative.   Musculoskeletal: Positive for myalgias.  Skin: Negative.   Neurological: Positive for weakness.    Physical Exam Updated Vital Signs BP (!) 163/91 (BP Location: Right Arm)   Pulse (!) 101   Temp 98.6 F (37 C) (Oral)   Resp 20   SpO2 95%   Physical Exam Vitals and nursing note reviewed.  Constitutional:      Appearance: She is well-developed.  HENT:     Head: Normocephalic.  Cardiovascular:     Rate and Rhythm: Normal rate and regular rhythm.  Pulmonary:     Effort: Pulmonary effort is normal.  Musculoskeletal:        General: Normal range of motion.     Cervical back: Normal range of motion and neck supple.  Skin:    General: Skin is warm and dry.  Neurological:     Mental Status: She is alert and oriented to person, place, and time.     ED Results / Procedures / Treatments   Labs (all labs ordered are listed, but only abnormal results are displayed) Labs Reviewed  BASIC METABOLIC PANEL - Abnormal; Notable for the following components:      Result Value   Sodium 130 (*)    Chloride 97 (*)    Glucose, Bld 105 (*)    Creatinine, Ser 1.16 (*)    Calcium 8.3 (*)    GFR calc non Af Amer 51 (*)    GFR calc Af Amer 59 (*)    All other components within normal limits  CBC - Abnormal; Notable for the following components:    RBC 3.57 (*)    Hemoglobin 11.5 (*)    HCT 34.1 (*)    All other components within normal limits  I-STAT BETA HCG BLOOD, ED ([facility], [facility], [facility] ONLY)  TROPONIN I (HIGH SENSITIVITY)  TROPONIN I (HIGH SENSITIVITY)    EKG None  Radiology No results found.  Procedures Procedures (including critical care time)  Medications Ordered in ED Medications  sodium chloride flush (NS) 0.9 % injection 3 mL (3 mLs Intravenous Not Given 05/25/19 2202)    ED Course  I have reviewed the triage vital signs and the nursing notes.  Pertinent labs & imaging results that were available during my care of the patient were reviewed by me and considered in my medical decision making (see chart for details).    MDM Rules/Calculators/A&P                      Patient to ED with known diagnosis of Influenza B and COVID 10 days ago. She reports getting no better, but is significantly worse. Taking Tylenol at home only.   She appears uncomfortable on arrival but nontoxic. IV started. Will give 500 ml, IV Zofran then attempt PO challenge. Hyponatremia to 130, otherwise labs are essentially unremarkable.   On re-evaluation, the patient's O2 saturation found to be 89% on room air, resolving with 3L Oxygen by [place] to 99%. She is drinking fluids and feels better.   Given new oxygen requirement in the setting of known COVID infection, will admit for further observation and treatment.  Final Clinical Impression(s) / ED Diagnoses Final diagnoses:  None   1. COVID+ 2. Hypoxia  Rx / DC Orders ED Discharge Orders    None       Elpidio Anis, Cordelia Poche 05/26/19  [id number]    Sabas Sous, MD 05/26/19 [phone]

## 2019-05-25 NOTE — ED Notes (Signed)
SPO2 drops to 75% after Fentanyl given to the pt. Pt placed on 3L [place].97% on 3L. We'll continue to monitor.

## 2019-05-26 DIAGNOSIS — M797 Fibromyalgia: Secondary | ICD-10-CM | POA: Diagnosis present

## 2019-05-26 DIAGNOSIS — Z981 Arthrodesis status: Secondary | ICD-10-CM | POA: Diagnosis not present

## 2019-05-26 DIAGNOSIS — Z23 Encounter for immunization: Secondary | ICD-10-CM | POA: Diagnosis not present

## 2019-05-26 DIAGNOSIS — F988 Other specified behavioral and emotional disorders with onset usually occurring in childhood and adolescence: Secondary | ICD-10-CM | POA: Diagnosis present

## 2019-05-26 DIAGNOSIS — G8929 Other chronic pain: Secondary | ICD-10-CM | POA: Diagnosis present

## 2019-05-26 DIAGNOSIS — E871 Hypo-osmolality and hyponatremia: Secondary | ICD-10-CM | POA: Diagnosis present

## 2019-05-26 DIAGNOSIS — M419 Scoliosis, unspecified: Secondary | ICD-10-CM

## 2019-05-26 DIAGNOSIS — U071 COVID-19: Principal | ICD-10-CM

## 2019-05-26 DIAGNOSIS — E039 Hypothyroidism, unspecified: Secondary | ICD-10-CM | POA: Diagnosis present

## 2019-05-26 DIAGNOSIS — J189 Pneumonia, unspecified organism: Secondary | ICD-10-CM

## 2019-05-26 DIAGNOSIS — J1289 Other viral pneumonia: Secondary | ICD-10-CM

## 2019-05-26 DIAGNOSIS — F329 Major depressive disorder, single episode, unspecified: Secondary | ICD-10-CM

## 2019-05-26 DIAGNOSIS — Z7982 Long term (current) use of aspirin: Secondary | ICD-10-CM | POA: Diagnosis not present

## 2019-05-26 DIAGNOSIS — R112 Nausea with vomiting, unspecified: Secondary | ICD-10-CM | POA: Diagnosis present

## 2019-05-26 DIAGNOSIS — R0902 Hypoxemia: Secondary | ICD-10-CM | POA: Diagnosis present

## 2019-05-26 DIAGNOSIS — I10 Essential (primary) hypertension: Secondary | ICD-10-CM | POA: Diagnosis present

## 2019-05-26 DIAGNOSIS — Z79890 Hormone replacement therapy (postmenopausal): Secondary | ICD-10-CM | POA: Diagnosis not present

## 2019-05-26 DIAGNOSIS — Z96642 Presence of left artificial hip joint: Secondary | ICD-10-CM | POA: Diagnosis present

## 2019-05-26 DIAGNOSIS — J1282 Pneumonia due to coronavirus disease 2019: Secondary | ICD-10-CM

## 2019-05-26 DIAGNOSIS — F419 Anxiety disorder, unspecified: Secondary | ICD-10-CM | POA: Diagnosis present

## 2019-05-26 DIAGNOSIS — K219 Gastro-esophageal reflux disease without esophagitis: Secondary | ICD-10-CM | POA: Diagnosis present

## 2019-05-26 DIAGNOSIS — Z87891 Personal history of nicotine dependence: Secondary | ICD-10-CM | POA: Diagnosis not present

## 2019-05-26 DIAGNOSIS — F32A Depression, unspecified: Secondary | ICD-10-CM

## 2019-05-26 DIAGNOSIS — J9601 Acute respiratory failure with hypoxia: Secondary | ICD-10-CM | POA: Diagnosis present

## 2019-05-26 HISTORY — DX: Pneumonia due to coronavirus disease 2019: J12.82

## 2019-05-26 HISTORY — DX: Pneumonia, unspecified organism: J18.9

## 2019-05-26 HISTORY — DX: COVID-19: U07.1

## 2019-05-26 LAB — BASIC METABOLIC PANEL
Anion gap: 11 (ref 5–15)
BUN: 9 mg/dL (ref 6–20)
CO2: 25 mmol/L (ref 22–32)
Calcium: 8.7 mg/dL — ABNORMAL LOW (ref 8.9–10.3)
Chloride: 101 mmol/L (ref 98–111)
Creatinine, Ser: 1.04 mg/dL — ABNORMAL HIGH (ref 0.44–1.00)
GFR calc Af Amer: >60 mL/min (ref >60)
GFR calc non Af Amer: 58 mL/min — ABNORMAL LOW (ref >60)
Glucose, Bld: 127 mg/dL — ABNORMAL HIGH (ref 70–99)
Potassium: 5.4 mmol/L — ABNORMAL HIGH (ref 3.5–5.1)
Sodium: 137 mmol/L (ref 135–145)

## 2019-05-26 LAB — LACTATE DEHYDROGENASE: LDH: 233 U/L — ABNORMAL HIGH (ref 98–192)

## 2019-05-26 LAB — D-DIMER, QUANTITATIVE: D-Dimer, Quant: 1.43 ug/mL-FEU — ABNORMAL HIGH (ref 0.00–0.50)

## 2019-05-26 LAB — C-REACTIVE PROTEIN: CRP: 27.9 mg/dL — ABNORMAL HIGH (ref <1.0)

## 2019-05-26 LAB — FIBRINOGEN: Fibrinogen: >800 mg/dL — ABNORMAL HIGH (ref 210–475)

## 2019-05-26 LAB — PROCALCITONIN: Procalcitonin: <0.1 ng/mL

## 2019-05-26 LAB — HIV ANTIBODY (ROUTINE TESTING W REFLEX): HIV Screen 4th Generation wRfx: NONREACTIVE

## 2019-05-26 MED ORDER — DEXAMETHASONE SODIUM PHOSPHATE 10 MG/ML IJ SOLN
6.0000 mg | Freq: Two times a day (BID) | INTRAMUSCULAR | Status: DC
  Administered 2019-05-26 – 2019-05-30 (×9): 6 mg via INTRAVENOUS
  Filled 2019-05-26 (×10): qty 1

## 2019-05-26 MED ORDER — RAMIPRIL 5 MG PO CAPS
5.0000 mg | ORAL_CAPSULE | Freq: Two times a day (BID) | ORAL | Status: DC
  Administered 2019-05-26 – 2019-05-30 (×9): 5 mg via ORAL
  Filled 2019-05-26 (×11): qty 1

## 2019-05-26 MED ORDER — BUPROPION HCL ER (XL) 150 MG PO TB24: 300.0000 mg | ORAL_TABLET | Freq: Every morning | ORAL | Status: DC

## 2019-05-26 MED ORDER — ASPIRIN EC 81 MG PO TBEC
81.0000 mg | DELAYED_RELEASE_TABLET | Freq: Every day | ORAL | Status: DC
  Administered 2019-05-26 – 2019-05-30 (×5): 81 mg via ORAL
  Filled 2019-05-26 (×6): qty 1

## 2019-05-26 MED ORDER — GABAPENTIN 300 MG PO CAPS: 300.0000 mg | ORAL_CAPSULE | ORAL | Status: DC | PRN

## 2019-05-26 MED ORDER — CARISOPRODOL 350 MG PO TABS
350.0000 mg | ORAL_TABLET | Freq: Every day | ORAL | Status: DC
  Administered 2019-05-27 – 2019-05-29 (×3): 350 mg via ORAL
  Filled 2019-05-26 (×5): qty 1

## 2019-05-26 MED ORDER — SODIUM CHLORIDE 0.9 % IV SOLN
100.0000 mg | Freq: Every day | INTRAVENOUS | Status: AC
  Administered 2019-05-27 – 2019-05-30 (×4): 100 mg via INTRAVENOUS
  Filled 2019-05-26 (×4): qty 20

## 2019-05-26 MED ORDER — METOPROLOL SUCCINATE ER 25 MG PO TB24: 50.0000 mg | ORAL_TABLET | Freq: Every day | ORAL | Status: DC

## 2019-05-26 MED ORDER — SODIUM CHLORIDE 0.9 % IV SOLN
200.0000 mg | Freq: Once | INTRAVENOUS | Status: AC
  Administered 2019-05-26: 200 mg via INTRAVENOUS
  Filled 2019-05-26: qty 200

## 2019-05-26 MED ORDER — METHOCARBAMOL 750 MG PO TABS
750.0000 mg | ORAL_TABLET | Freq: Once | ORAL | Status: AC
  Administered 2019-05-26: 750 mg via ORAL
  Filled 2019-05-26: qty 1

## 2019-05-26 MED ORDER — DIAZEPAM 2 MG PO TABS
5.0000 mg | ORAL_TABLET | Freq: Three times a day (TID) | ORAL | Status: DC | PRN
  Administered 2019-05-26: 5 mg via ORAL
  Administered 2019-05-27: 2 mg via ORAL
  Administered 2019-05-27 – 2019-05-29 (×3): 5 mg via ORAL
  Filled 2019-05-26 (×2): qty 3
  Filled 2019-05-26: qty 1
  Filled 2019-05-26 (×2): qty 3

## 2019-05-26 MED ORDER — RAMIPRIL 5 MG PO CAPS: 5.0000 mg | ORAL_CAPSULE | Freq: Every day | ORAL | Status: DC

## 2019-05-26 MED ORDER — PANTOPRAZOLE SODIUM 40 MG PO TBEC
40.0000 mg | DELAYED_RELEASE_TABLET | Freq: Every day | ORAL | Status: DC
  Administered 2019-05-26 – 2019-05-29 (×4): 40 mg via ORAL
  Filled 2019-05-26 (×4): qty 1

## 2019-05-26 MED ORDER — MONTELUKAST SODIUM 10 MG PO TABS
10.0000 mg | ORAL_TABLET | Freq: Every morning | ORAL | Status: DC
  Administered 2019-05-27 – 2019-05-30 (×4): 10 mg via ORAL
  Filled 2019-05-26 (×5): qty 1

## 2019-05-26 MED ORDER — ONDANSETRON HCL 4 MG/2ML IJ SOLN: 4.0000 mg | Freq: Four times a day (QID) | INTRAMUSCULAR | Status: DC | PRN

## 2019-05-26 MED ORDER — LEVOTHYROXINE SODIUM 75 MCG PO TABS
75.0000 ug | ORAL_TABLET | Freq: Every morning | ORAL | Status: DC
  Administered 2019-05-26 – 2019-05-30 (×5): 75 ug via ORAL
  Filled 2019-05-26 (×6): qty 1

## 2019-05-26 MED ORDER — VITAMIN D 25 MCG (1000 UNIT) PO TABS
5000.0000 [IU] | ORAL_TABLET | Freq: Every day | ORAL | Status: DC
  Administered 2019-05-26 – 2019-05-30 (×5): 5000 [IU] via ORAL
  Filled 2019-05-26 (×5): qty 5

## 2019-05-26 MED ORDER — ENOXAPARIN SODIUM 40 MG/0.4ML ~~LOC~~ SOLN
40.0000 mg | SUBCUTANEOUS | Status: DC
  Administered 2019-05-26 – 2019-05-30 (×5): 40 mg via SUBCUTANEOUS
  Filled 2019-05-26 (×5): qty 0.4

## 2019-05-26 MED ORDER — LEVOTHYROXINE SODIUM 75 MCG PO TABS: 75.0000 ug | ORAL_TABLET | Freq: Every day | ORAL | Status: DC

## 2019-05-26 MED ORDER — GABAPENTIN 300 MG PO CAPS
300.0000 mg | ORAL_CAPSULE | Freq: Four times a day (QID) | ORAL | Status: DC
  Administered 2019-05-26 – 2019-05-30 (×15): 300 mg via ORAL
  Filled 2019-05-26 (×15): qty 1

## 2019-05-26 MED ORDER — DULOXETINE HCL 60 MG PO CPEP: 60.0000 mg | ORAL_CAPSULE | Freq: Every morning | ORAL | Status: DC

## 2019-05-26 MED ORDER — DEXAMETHASONE SODIUM PHOSPHATE 10 MG/ML IJ SOLN
6.0000 mg | INTRAMUSCULAR | Status: DC
  Administered 2019-05-26: 6 mg via INTRAVENOUS
  Filled 2019-05-26: qty 1

## 2019-05-26 NOTE — ED Notes (Signed)
Pt able to ambulate in the room to commode and back to bed.  Alert and oriented without distress noted.  Taking fluids and foods without difficulty. Denies any current needs.  MD has asked for daughter number to update.

## 2019-05-26 NOTE — Progress Notes (Addendum)
PROGRESS NOTE    Stephanie Cobb  JKD:[id number] DOB: 06/28/1960 DOA: 05/25/2019 PCP: Lowella Dandy, NP   Brief Narrative: 58 year old with past medical history significant for hypertension, fibromyalgia, scoliosis who presents complaining of worsening shortness of breath, nausea vomiting and diarrhea.  Patient reported generalized body aches about 2 weeks prior to admission.  She was evaluated by her primary care physician she was diagnosed with both influenza B and Covid on 12/11.  She finished a course of Tamiflu.  She presents complaining of worsening shortness of breath.  Evaluation in the ED: She was found to be hypoxic oxygen 89 on room air she was placed on 2 L.  After fentanyl oxygen sat decreased  and she was placed on ~3 L Has been admitted for acute hypoxic respiratory failure in the setting of COVID-19 pneumonia.   Assessment & Plan:   Principal Problem:   Pneumonia due to COVID-19 virus Active Problems:   Multifocal pneumonia   Fibromyalgia   Scoliosis   Depression   Anxiety   Hypothyroidism   1-Acute Hypoxic Respiratory Failure in the setting of COVID-19 multifocal pneumonia: -Multifocal pneumonia on chest x-ray, CRP 27, procalcitonin 0.10 -Patient currently requiring 3 L of oxygen. -Could  consider Actemra if oxygen requirement increases.  -Follow inflammatory markers. -Continue with Remdesivir. -Continue with the dexamethasone changed to twice daily. -Lovenox for DVT prophylaxis.  -Discussed with Daughter who is a PA, she would agree with Actemra if needed. If patient required more oxygen supplementation will need Actemra.   COVID-19 Labs  Recent Labs    05/26/19 0602  DDIMER 1.43*  LDH 233*  CRP 27.9*    No results found for: SARSCOV2NAA 2-Influenza B;  She completed Tamiflu.  3-Hyponatremia: Encourage oral intake 4-Hypertension: Continue with ramipril 5-Hypothyroidism: Continue with Synthroid 6-Fibromyalgia, Chronic pain: resume Soma, gabapentin.     Estimated body mass index is 29.62 kg/m as calculated from the following:   Height as of this encounter: 5\' 5"  (1.651 m).   Weight as of this encounter: 80.7 kg.   DVT prophylaxis: Lovenox Code Status: Full code Family Communication: care discussed with patient. Will update daughter later.  Disposition Plan: Remain in the hospital for treatment of acute hypoxic respiratory failure secondary to Covid 19 PNA. Requiring oxygen supplementation and Remdesivir.  Consultants:   none  Procedures:   none  Antimicrobials/Other:  Remdesivir  Subjective: She is alert, she report dry cough. Her oxygen drops when she has coughing spells.    Objective: Vitals:   05/26/19 0445 05/26/19 0500 05/26/19 0515 05/26/19 0530  BP: 125/74 128/72 132/78 135/75  Pulse: 90 86 88 87  Resp: (!) 24 (!) 24 (!) 23 14  Temp:      TempSrc:      SpO2: 97% 96% 95% 94%  Weight:      Height:        Intake/Output Summary (Last 24 hours) at 05/26/2019 0847 Last data filed at 05/26/2019 0729 Gross per 24 hour  Intake 791.21 ml  Output 1200 ml  Net -408.79 ml   Filed Weights   05/25/19 2239  Weight: 80.7 kg    Examination:  General exam: Appears calm and comfortable  Respiratory system: Bilateral ronchus. Respiratory effort normal. Cardiovascular system: S1 & S2 heard, RRR. No JVD, murmurs, rubs, gallops or clicks. No pedal edema. Gastrointestinal system: Abdomen is nondistended, soft and nontender. No organomegaly or masses felt. Normal bowel sounds heard. Central nervous system: Alert and oriented. No focal neurological deficits. Extremities: Symmetric  5 x 5 power. Skin: No rashes, lesions or ulcers Psychiatry: Judgement and insight appear normal. Mood & affect appropriate.     Data Reviewed: I have personally reviewed following labs and imaging studies  CBC: Recent Labs  Lab 05/25/19 2032  WBC 8.5  HGB 11.5*  HCT 34.1*  MCV 95.5  PLT 275   Basic Metabolic Panel: Recent Labs   Lab 05/25/19 2032  NA 130*  K 5.0  CL 97*  CO2 24  GLUCOSE 105*  BUN 14  CREATININE 1.16*  CALCIUM 8.3*   GFR: Estimated Creatinine Clearance: 54.1 mL/min (A) (by C-G formula based on SCr of 1.16 mg/dL (H)). Liver Function Tests: No results for input(s): AST, ALT, ALKPHOS, BILITOT, PROT, ALBUMIN in the last 168 hours. No results for input(s): LIPASE, AMYLASE in the last 168 hours. No results for input(s): AMMONIA in the last 168 hours. Coagulation Profile: No results for input(s): INR, PROTIME in the last 168 hours. Cardiac Enzymes: No results for input(s): CKTOTAL, CKMB, CKMBINDEX, TROPONINI in the last 168 hours. BNP (last 3 results) No results for input(s): PROBNP in the last 8760 hours. HbA1C: No results for input(s): HGBA1C in the last 72 hours. CBG: No results for input(s): GLUCAP in the last 168 hours. Lipid Profile: No results for input(s): CHOL, HDL, LDLCALC, TRIG, CHOLHDL, LDLDIRECT in the last 72 hours. Thyroid Function Tests: No results for input(s): TSH, T4TOTAL, FREET4, T3FREE, THYROIDAB in the last 72 hours. Anemia Panel: No results for input(s): VITAMINB12, FOLATE, FERRITIN, TIBC, IRON, RETICCTPCT in the last 72 hours. Sepsis Labs: Recent Labs  Lab 05/26/19 0602  PROCALCITON <0.10    No results found for this or any previous visit (from the past 240 hour(s)).       Radiology Studies: DG Chest Portable 1 View  Result Date: 05/25/2019 CLINICAL DATA:  58 year old female with chest pain. Positive COVID-19. EXAM: PORTABLE CHEST 1 VIEW COMPARISON:  Chest radiograph dated 12/30/2013 and CT dated 05/07/2008. FINDINGS: Bilateral peripheral and subpleural densities most consistent with multifocal pneumonia, possibly atypical or viral in etiology. Clinical correlation is recommended. No pleural effusion or pneumothorax. Stable cardiac silhouette. There is scoliosis. Partially visualized lower cervical ACDF. No acute osseous pathology. IMPRESSION: Multifocal  pneumonia, possibly atypical or viral in etiology. Clinical correlation is recommended. Electronically Signed   By: Elgie Collard M.D.   On: 05/25/2019 22:21        Scheduled Meds: . dexamethasone (DECADRON) injection  6 mg Intravenous Q24H  . enoxaparin (LOVENOX) injection  40 mg Subcutaneous Q24H  . levothyroxine  75 mcg Oral q morning - 10a  . ramipril  5 mg Oral BID  . sodium chloride flush  3 mL Intravenous Once   Continuous Infusions: . [START ON 05/27/2019] remdesivir 100 mg in NS 100 mL       LOS: 0 days    Time spent: 35 minutes.     Alba Cory, MD [facility]   If 7PM-7AM, please contact night-coverage [email or web address] Password [facility] 05/26/2019, 8:47 AM

## 2019-05-26 NOTE — ED Notes (Signed)
Lunch Tray Ordered @ 1124. 

## 2019-05-26 NOTE — H&P (Addendum)
History and Physical    KMYA PLACIDE YDX:[id number] DOB: 05-May-1961 DOA: 05/25/2019  PCP: Lowella Dandy, NP  Patient coming from: Home  I have personally briefly reviewed patient's old medical records in [place]  Chief Complaint: Worsening shortness of breath, nausea and vomiting and diarrhea  HPI: STEPHENIE NAVEJAS is a 58 y.o. female with medical history significant of hypertension, fibromyalgia, scoliosis who presents with concerns of worsening shortness of breath, nausea, vomiting and diarrhea. Patient reports that she started to note generalized body aches about 2 weeks ago and went to her primary physician and she was diagnosed with both influenza B and Covid on 12/11.  She has since finished a course of Tamiflu which initially helped.  She then noticed progressive worsening of her symptoms including nausea, occasional vomiting and diarrhea.  Also started to note increasing shortness of breath and tried an inhaler today without any relief.  She reports that her boss and his family were positive for Covid and think she might have contracted it at work.  Patient is a former smoker but quit about 15 years ago.  Denies alcohol or illicit drug use.  ED Course: She was afebrile and intermittently hypertensive up to 163/91 and had desaturation down to 89% on room air and had to be placed on 2 L.  Her oxygen further desaturated following 50 mcg of fentanyl and she was on 3 L at the time my evaluation. CBC showed no leukocytosis and had stable anemia with hemoglobin of 11.5. Sodium of 130, glucose of 105, creatinine of 1.16 which appears to be at baseline. Troponin of 6 and 7.  EKG shows tachycardia. Point-of-care Covid test positive. Chest x-ray showed multifocal pneumonia.  Review of Systems:  Constitutional: No Fever ENT/Mouth: No sore throat, No Rhinorrhea Eyes:  No Vision Changes Cardiovascular: + Chest Pain, + SOB Respiratory: +Cough Gastrointestinal:+ Nausea, + Vomiting, +  Diarrhea,No Pain Musculoskeletal: , + Myalgias Skin: No Skin Lesions, No Pruritus, Neuro: no Weakness, No Numbness, Psych: + decrease appetite   Past Medical History:  Diagnosis Date  . ADD (attention deficit disorder)   . Anemia   . Anxiety   . Arthritis    OA / PAIN BOTH HIPS  . Depression   . Fibromyalgia   . GERD (gastroesophageal reflux disease)   . Headache(784.0)    MIGRAINES  . Heart palpitations    NUCLEAR STRESS TEST 12/14/13 AT [facility]- NO ISCHEMIA, EF 74%  . Hypertension   . Hypertriglyceridemia   . Hypothyroidism   . Insomnia   . Kidney insufficiency    PT STATES BUN SLIGHTLY ELEVATED - HER DOCTORS ARE WATCHING  . MRSA (methicillin resistant Staphylococcus aureus)    hx of 2012- left upper leg - treated with Vancomycin   . Pain    CHRONIC NECK PAIN - DDD CERVICOTHORACIC; HX OF CERVICA FUSION - PT STATES 2 AREAS HAVE NOT FUSED AND SHE WILL NEED FURTHER SURGERY IN THE FUTURE  . Pneumonia    ABOUT 6 YRS AGO  . PONV (postoperative nausea and vomiting)   . Scoliosis     Past Surgical History:  Procedure Laterality Date  . ABDOMINAL HYSTERECTOMY    . CERVICAL FUSION  2011  . 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 TOTAL HIP ARTHROPLASTY Right 02/23/2014   Procedure: RIGHT TOTAL HIP ARTHROPLASTY ANTERIOR  APPROACH;  Surgeon: Shelda Pal, MD;  Location: [facility];  Service: Orthopedics;  Laterality: Right;  . TUBAL LIGATION       reports that she has quit smoking. Her smoking use included cigarettes. She has a 10.00 pack-year smoking history. She has never used smokeless tobacco. She reports that she does not drink alcohol or use drugs.  Allergies  Allergen Reactions  . Mupirocin     PT STATES AFTER USING MUPIROCIN OINT IN HER NOSTRILS - SHE EXPERIENCED SEVERE HEADACHE RIGHT SIDE OF HEAD, SEVERE PAIN  RIGHT EAR AND NUMBNESS RIGHT SIDE OF HEAD AND SEVERE DIZZINESS.  . Other     PT STATES SHE CAN'T HAVE MRI - HAS METAL SCREWS IN HER SPINE FROM CERVICAL  FUSION.      Prior to Admission medications   Medication Sig Start Date End Date Taking? Authorizing Provider  amphetamine-dextroamphetamine (ADDERALL) 20 MG tablet Take 20-40 mg by mouth 2 (two) times daily. 2 tablets in the morning and 1 in the afternoon    [provider]  aspirin 325 MG EC tablet Take 1 tablet (325 mg total) by mouth 2 (two) times daily. 02/25/14   Lanney Gins, PA-C  buPROPion (WELLBUTRIN XL) 300 MG 24 hr tablet Take 300 mg by mouth every morning.    [provider]  Cholecalciferol (VITAMIN D-3) 5000 UNITS TABS Take 5,000 Units by mouth daily.    [provider]  diazepam (VALIUM) 5 MG tablet Take 5 mg by mouth every 8 (eight) hours as needed for muscle spasms.    [provider]  docusate sodium 100 MG CAPS Take 100 mg by mouth 2 (two) times daily. 02/24/14   Lanney Gins, PA-C  doxepin (SINEQUAN) 50 MG capsule Take 50 mg by mouth at bedtime.     [provider]  DULoxetine (CYMBALTA) 60 MG capsule Take 60 mg by mouth every morning.    [provider]  estradiol (ESTRACE) 2 MG tablet Take 2 mg by mouth daily.    [provider]  ferrous sulfate 325 (65 FE) MG tablet Take 325 mg by mouth 3 (three) times daily after meals. Patient stopped on 02/08/2014 01/14/14   Lanney Gins, PA-C  fexofenadine (ALLEGRA) 60 MG tablet Take 60 mg by mouth daily as needed for allergies or rhinitis.    [provider]  gabapentin (NEURONTIN) 300 MG capsule Take 300-600 mg by mouth 2 (two) times daily. 1 in the morning and 2 at night    [provider]  Glucosamine-Chondroitin (GLUCOSAMINE CHONDR COMPLEX PO) Take 1 tablet by mouth 2 (two) times daily.    [provider]  HYDROcodone-acetaminophen (NORCO) 7.5-325 MG per tablet Take 1-2 tablets by mouth  every 4 (four) hours as needed for moderate pain. 02/25/14   Lanney Gins, PA-C  levothyroxine (SYNTHROID, LEVOTHROID) 50 MCG tablet Take 50 mcg by mouth every morning.    [provider]  methocarbamol (ROBAXIN) 500 MG tablet Take 1 tablet (500 mg total) by mouth every 6 (six) hours as needed for muscle spasms. 02/24/14   Lanney Gins, PA-C  metoprolol succinate (TOPROL-XL) 50 MG 24 hr tablet Take 50 mg by mouth at bedtime. Take with or immediately following a meal.    [provider]  montelukast (SINGULAIR) 10 MG tablet Take 10 mg by mouth every morning.     [provider]  Omega-3 Fatty Acids (SUPER OMEGA 3 PO) Take 2 capsules by mouth 2 (two) times daily.    [provider]  pantoprazole (PROTONIX) 40 MG tablet Take 40 mg by mouth at bedtime.     [provider]  polyethylene glycol (MIRALAX / GLYCOLAX) packet Take 17 g by mouth 2 (two) times daily. 02/24/14   Lanney Gins, PA-C  ramipril (ALTACE) 5 MG capsule Take 5 mg by mouth at bedtime.    [provider]  rizatriptan (MAXALT-MLT) 10 MG disintegrating tablet Take 10 mg by mouth every 2 (two) hours as needed for migraine. May repeat in 2 hours if needed    [provider]    Physical Exam: Vitals:   05/25/19 2300 05/25/19 2315 05/25/19 2330 05/25/19 2345  BP: 107/62 (!) 152/85 (!) 146/84 136/79  Pulse: 91 91 92 90  Resp: (!) 26 (!) 21 18 (!) 25  Temp:      TempSrc:      SpO2: 100% 97% 97% 99%  Weight:      Height:        Constitutional: NAD, calm, comfortable, non-toxic thin female who appears younger than her age laying upright in bed Vitals:   05/25/19 2300 05/25/19 2315 05/25/19 2330 05/25/19 2345  BP: 107/62 (!) 152/85 (!) 146/84 136/79  Pulse: 91 91 92 90  Resp: (!) 26 (!) 21 18 (!) 25  Temp:      TempSrc:      SpO2: 100% 97% 97% 99%  Weight:      Height:       Eyes: PERRL, lids and conjunctivae normal ENMT: Mucous membranes are moist.  Neck:  normal, supple,  Respiratory: Bibasilar rhonchi with coughing during deep inspiration.  Normal respiratory effort on 3 L via nasal cannula. No accessory muscle use.  Cardiovascular: Regular rate and rhythm, no murmurs / rubs / gallops. No extremity edema.  Abdomen: no tenderness, no masses palpated. Bowel sounds positive.  GU: purwick catheter in place with urine output Musculoskeletal: no clubbing / cyanosis. No joint deformity upper and lower extremities. Good ROM, no contractures. Normal muscle tone.  Skin: no rashes, lesions, ulcers. No induration Neurologic: CN 2-12 grossly intact. Sensation intact. Strength 5/5 in all 4.  Psychiatric: Normal judgment and insight. Alert and oriented x 3. Normal mood.    Labs on Admission: I have personally reviewed following labs and imaging studies  CBC: Recent Labs  Lab 05/25/19 2032  WBC 8.5  HGB 11.5*  HCT 34.1*  MCV 95.5  PLT 275   Basic Metabolic Panel: Recent Labs  Lab 05/25/19 2032  NA 130*  K 5.0  CL 97*  CO2 24  GLUCOSE 105*  BUN 14  CREATININE 1.16*  CALCIUM 8.3*   GFR: Estimated Creatinine Clearance: 54.1 mL/min (A) (by C-G formula based on SCr of 1.16 mg/dL (H)). Liver Function Tests: No results for input(s): AST, ALT, ALKPHOS, BILITOT, PROT, ALBUMIN in the last 168 hours. No results for input(s): LIPASE, AMYLASE in the last 168 hours. No results for input(s): AMMONIA in the last 168 hours. Coagulation Profile: No results for input(s): INR, PROTIME in the last 168 hours. Cardiac Enzymes: No results for input(s): CKTOTAL, CKMB, CKMBINDEX, TROPONINI in the last 168 hours. BNP (last 3 results) No results for input(s): PROBNP in the last 8760 hours. HbA1C: No results for input(s): HGBA1C in the last 72 hours. CBG: No results for input(s): GLUCAP in the last 168 hours. Lipid Profile: No results for input(s): CHOL, HDL, LDLCALC, TRIG, CHOLHDL, LDLDIRECT in the last 72 hours. Thyroid Function Tests: No results for  input(s): TSH, T4TOTAL, FREET4, T3FREE,  THYROIDAB in the last 72 hours. Anemia Panel: No results for input(s): VITAMINB12, FOLATE, FERRITIN, TIBC, IRON, RETICCTPCT in the last 72 hours. Urine analysis:    Component Value Date/Time   COLORURINE YELLOW 02/22/2014 1409   APPEARANCEUR CLOUDY (A) 02/22/2014 1409   LABSPEC 1.012 02/22/2014 1409   PHURINE 5.5 02/22/2014 1409   GLUCOSEU NEGATIVE 02/22/2014 1409   HGBUR MODERATE (A) 02/22/2014 1409   BILIRUBINUR NEGATIVE 02/22/2014 1409   KETONESUR NEGATIVE 02/22/2014 1409   PROTEINUR NEGATIVE 02/22/2014 1409   UROBILINOGEN 0.2 02/22/2014 1409   NITRITE NEGATIVE 02/22/2014 1409   LEUKOCYTESUR LARGE (A) 02/22/2014 1409    Radiological Exams on Admission: DG Chest Portable 1 View  Result Date: 05/25/2019 CLINICAL DATA:  58 year old female with chest pain. Positive COVID-19. EXAM: PORTABLE CHEST 1 VIEW COMPARISON:  Chest radiograph dated 12/30/2013 and CT dated 05/07/2008. FINDINGS: Bilateral peripheral and subpleural densities most consistent with multifocal pneumonia, possibly atypical or viral in etiology. Clinical correlation is recommended. No pleural effusion or pneumothorax. Stable cardiac silhouette. There is scoliosis. Partially visualized lower cervical ACDF. No acute osseous pathology. IMPRESSION: Multifocal pneumonia, possibly atypical or viral in etiology. Clinical correlation is recommended. Electronically Signed   By: Elgie CollardArash  Radparvar M.D.   On: 05/25/2019 22:21    EKG: Independently reviewed.   Assessment/Plan Acute hypoxic respiratory failure secondary to multifocal Covid pneumonia -Start IV 6 mg Decadron and remdesivir -As needed Zofran for nausea/vomiting - check inflammatory markers   Hypertension -Continue metoprolol and ramipril  Fibromyalgia - Asymptomatic  Scoliosis -Complicates respiratory status  Depression/anxiety -stable  Hypothyroidism -Continue levothyroxine  Medication reconciliation not completed  at the time of admission.  Will need to follow-up.  DVT prophylaxis:.Lovenox Code Status:Full Family Communication: Plan discussed with patient at bedside  disposition Plan: Home with at least 2 midnight stays  Consults called:  Admission status: inpatient  Stefan Markarian T Abrielle Finck DO [facility]   If 7PM-7AM, please contact night-coverage [email or web address] Password [email or web address]  05/26/2019, 1:23 AM

## 2019-05-26 NOTE — ED Notes (Signed)
Breakfast Ordered 

## 2019-05-26 NOTE — ED Notes (Signed)
Daughter-  Contact number   [phone]

## 2019-05-26 NOTE — ED Notes (Signed)
Upon entering patient room, O2 not on, RA sats 83%. Patient remains a/o x4. Intermittent cough present. O2 turned back on to 3L, per documentation history, O2 improved to 94%.

## 2019-05-27 DIAGNOSIS — M797 Fibromyalgia: Secondary | ICD-10-CM

## 2019-05-27 DIAGNOSIS — E039 Hypothyroidism, unspecified: Secondary | ICD-10-CM

## 2019-05-27 DIAGNOSIS — I10 Essential (primary) hypertension: Secondary | ICD-10-CM

## 2019-05-27 LAB — COMPREHENSIVE METABOLIC PANEL
ALT: 35 U/L (ref 0–44)
AST: 34 U/L (ref 15–41)
Albumin: 3 g/dL — ABNORMAL LOW (ref 3.5–5.0)
Alkaline Phosphatase: 123 U/L (ref 38–126)
Anion gap: 8 (ref 5–15)
BUN: 20 mg/dL (ref 6–20)
CO2: 28 mmol/L (ref 22–32)
Calcium: 9 mg/dL (ref 8.9–10.3)
Chloride: 99 mmol/L (ref 98–111)
Creatinine, Ser: 0.89 mg/dL (ref 0.44–1.00)
GFR calc Af Amer: >60 mL/min (ref >60)
GFR calc non Af Amer: >60 mL/min (ref >60)
Glucose, Bld: 162 mg/dL — ABNORMAL HIGH (ref 70–99)
Potassium: 4.9 mmol/L (ref 3.5–5.1)
Sodium: 135 mmol/L (ref 135–145)
Total Bilirubin: 0.8 mg/dL (ref 0.3–1.2)
Total Protein: 6.9 g/dL (ref 6.5–8.1)

## 2019-05-27 LAB — CBC WITH DIFFERENTIAL/PLATELET
Abs Immature Granulocytes: 0.06 10*3/uL (ref 0.00–0.07)
Basophils Absolute: 0 10*3/uL (ref 0.0–0.1)
Basophils Relative: 0 %
Eosinophils Absolute: 0 10*3/uL (ref 0.0–0.5)
Eosinophils Relative: 0 %
HCT: 33.1 % — ABNORMAL LOW (ref 36.0–46.0)
Hemoglobin: 10.9 g/dL — ABNORMAL LOW (ref 12.0–15.0)
Immature Granulocytes: 1 %
Lymphocytes Relative: 6 %
Lymphs Abs: 0.6 10*3/uL — ABNORMAL LOW (ref 0.7–4.0)
MCH: 31.4 pg (ref 26.0–34.0)
MCHC: 32.9 g/dL (ref 30.0–36.0)
MCV: 95.4 fL (ref 80.0–100.0)
Monocytes Absolute: 0.5 10*3/uL (ref 0.1–1.0)
Monocytes Relative: 5 %
Neutro Abs: 10 10*3/uL — ABNORMAL HIGH (ref 1.7–7.7)
Neutrophils Relative %: 88 %
Platelets: 344 10*3/uL (ref 150–400)
RBC: 3.47 MIL/uL — ABNORMAL LOW (ref 3.87–5.11)
RDW: 11.7 % (ref 11.5–15.5)
WBC: 11.2 10*3/uL — ABNORMAL HIGH (ref 4.0–10.5)
nRBC: 0 % (ref 0.0–0.2)

## 2019-05-27 LAB — FERRITIN: Ferritin: 169 ng/mL (ref 11–307)

## 2019-05-27 LAB — MAGNESIUM: Magnesium: 2 mg/dL (ref 1.7–2.4)

## 2019-05-27 LAB — C-REACTIVE PROTEIN: CRP: 21 mg/dL — ABNORMAL HIGH (ref <1.0)

## 2019-05-27 LAB — PHOSPHORUS: Phosphorus: 2.5 mg/dL (ref 2.5–4.6)

## 2019-05-27 LAB — D-DIMER, QUANTITATIVE: D-Dimer, Quant: 1.15 ug/mL-FEU — ABNORMAL HIGH (ref 0.00–0.50)

## 2019-05-27 MED ORDER — PNEUMOCOCCAL VAC POLYVALENT 25 MCG/0.5ML IJ INJ
0.5000 mL | INJECTION | INTRAMUSCULAR | Status: DC
  Filled 2019-05-27: qty 0.5

## 2019-05-27 MED ORDER — ACETAMINOPHEN 325 MG PO TABS: 650.0000 mg | ORAL_TABLET | Freq: Four times a day (QID) | ORAL | Status: DC | PRN

## 2019-05-27 NOTE — Progress Notes (Signed)
Incentive spirometer performed with RN teaching, reaching a goal of 1750. Flutter valvue and prone lying discussed and demonstrated as well with positive open ended discussion.

## 2019-05-27 NOTE — Progress Notes (Signed)
Pt admitted last pm, very stable, O2 on via [place] 2L, with sats >95%, good cough and deep breath techniques. Pt slept briefly, await md for pt assessment this am. This RN assessed pt with new admit questionairre completed. Pt requests both Flu and Pneumovax while hear. Pt c/o Headache this am, has histoy of this, note to md x2,  for med orders, await response from MD. Pt bathed in tub last pm, mostly independent. OOB to recliner through night with better quallity lsleep. Report to be given to next shift at bedside.

## 2019-05-27 NOTE — Progress Notes (Signed)
PROGRESS NOTE                                                                                                                                                                                                             Patient Demographics:    Stephanie Cobb, is a 58 y.o. female, DOB - 07/22/1960, FOY:[id number]  Outpatient Primary MD for the patient is Lowella Dandy, NP   Admit date - 05/25/2019   LOS - 1  Chief Complaint  Patient presents with  . Influenza  . Chest Pain       Brief Narrative: Patient is a 58 y.o. female with PMHx of HTN, fibromyalgia, scoliosis-who presented with nausea, vomiting and shortness of breath-she was found to have acute hypoxic respiratory failure secondary to COVID-19 pneumonia.    Per H&P-patient was diagnosed with influenza B and Covid at her PCPs office on 12/11.   Subjective:    Stephanie Cobb today feels better today-she was lying comfortably in bed.   Assessment  & Plan :   Acute Hypoxic Resp Failure due to Covid 19 Viral pneumonia: Remains stable on just 2-3 L of oxygen-however her CRP remains significantly elevated.  Plans are to cautiously continue with steroids and remdesivir-if her O2 requirements increase-she will require initiation of Actemra.  I have already discussed the rationale, risks/benefits of Actemra-she consents.  She does not have a history of tuberculosis or hepatitis B.  Fever: afebrile  O2 requirements:  SpO2: 97 % O2 Flow Rate (L/min): 3 L/min   COVID-19 Labs: Recent Labs    05/26/19 0602 05/27/19 0305  DDIMER 1.43* 1.15*  FERRITIN  --  169  LDH 233*  --   CRP 27.9* 21.0*    No results found for: BNP  Recent Labs  Lab 05/26/19 0602  PROCALCITON <0.10    No results found for: SARSCOV2NAA   COVID-19 Medications: Steroids: 12/21>> Remdesivir: 12/21>>  Other medications: Diuretics:Euvolemic-Lasix 40 mg IV x1 to maintain negative  balance. Antibiotics:Not needed as no evidence of bacterial infection Insulin: Monitor CBGs closely while on steroids-check A1c.    Prone/Incentive Spirometry: encouraged incentive spirometry use 3-4/hour.  DVT Prophylaxis  :  Lovenox   Influenza B: Per prior notes-appears to have completed a course of Tamiflu.  HTN: Controlled-continue ramipril  Hypothyroidism: Continue Synthroid  Fibromyalgia/scoliosis/chronic pain: Controlled-continue Soma and Neurontin.  Consults  :  None  Procedures  :  None  ABG: No results found for: PHART, PCO2ART, PO2ART, HCO3, TCO2, ACIDBASEDEF, O2SAT  Vent Settings: N/A   Condition - Stable  Family Communication  : Left a voicemail for spouse  Code Status :  Full Code  Diet :  Diet Order            Diet Heart Room service appropriate? Yes; Fluid consistency: Thin  Diet effective now               Disposition Plan  :  Remain hospitalized  Barriers to discharge: Hypoxia requiring O2 supplementation/complete 5 days of IV Remdesivir  Antimicorbials  :    Anti-infectives (From admission, onward)   Start     Dose/Rate Route Frequency Ordered Stop   05/27/19 1000  remdesivir 100 mg in sodium chloride 0.9 % 100 mL IVPB     100 mg 200 mL/hr over 30 Minutes Intravenous Daily 05/26/19 0119 05/31/19 0959   05/26/19 0130  remdesivir 200 mg in sodium chloride 0.9% 250 mL IVPB     200 mg 580 mL/hr over 30 Minutes Intravenous Once 05/26/19 0119 05/26/19 0350      Inpatient Medications  Scheduled Meds: . aspirin EC  81 mg Oral Daily  . carisoprodol  350 mg Oral QHS  . cholecalciferol  5,000 Units Oral Daily  . dexamethasone (DECADRON) injection  6 mg Intravenous Q12H  . enoxaparin (LOVENOX) injection  40 mg Subcutaneous Q24H  . gabapentin  300 mg Oral QID  . levothyroxine  75 mcg Oral q morning - 10a  . montelukast  10 mg Oral q morning - 10a  . pantoprazole  40 mg Oral QHS  . [START ON 05/28/2019] pneumococcal 23 valent vaccine  0.5 mL  Intramuscular Tomorrow-1000  . ramipril  5 mg Oral BID  . sodium chloride flush  3 mL Intravenous Once   Continuous Infusions: . remdesivir 100 mg in NS 100 mL 100 mg (05/27/19 0936)   PRN Meds:.acetaminophen, diazepam, ondansetron (ZOFRAN) IV   Time Spent in minutes  25   See all Orders from today for further details   Jeoffrey MassedShanker Poet Hineman M.D on 05/27/2019 at 2:15 PM  To page go to [email or web address] - use universal password  [facility] -  Office  [phone]    Objective:   Vitals:   05/27/19 0545 05/27/19 0600 05/27/19 0615 05/27/19 0738  BP:  137/83  129/82  Pulse: 79 79 78 76  Resp: (!) 21 (!) 28 (!) 27 (!) 22  Temp:    98.7 F (37.1 C)  TempSrc:    Oral  SpO2: 97% 95% 98% 97%  Weight:      Height:        Wt Readings from Last 3 Encounters:  05/25/19 80.7 kg  02/23/14 76.6 kg  02/22/14 76.6 kg     Intake/Output Summary (Last 24 hours) at 05/27/2019 1415 Last data filed at 05/27/2019 1300 Gross per 24 hour  Intake 1080 ml  Output 700 ml  Net 380 ml     Physical Exam Gen Exam:Alert awake-not in any distress HEENT:atraumatic, normocephalic Chest: B/L clear to auscultation anteriorly CVS:S1S2 regular Abdomen:soft non tender, non distended Extremities:no edema Neurology: Non focal Skin: no rash   Data Review:    CBC Recent Labs  Lab 05/25/19 2032 05/27/19 0305  WBC 8.5 11.2*  HGB 11.5* 10.9*  HCT 34.1* 33.1*  PLT 275 344  MCV 95.5 95.4  MCH 32.2 31.4  MCHC 33.7 32.9  RDW 11.5 11.7  LYMPHSABS  --  0.6*  MONOABS  --  0.5  EOSABS  --  0.0  BASOSABS  --  0.0    Chemistries  Recent Labs  Lab 05/25/19 2032 05/26/19 1232 05/27/19 0305  NA 130* 137 135  K 5.0 5.4* 4.9  CL 97* 101 99  CO2 24 25 28   GLUCOSE 105* 127* 162*  BUN 14 9 20   CREATININE 1.16* 1.04* 0.89  CALCIUM 8.3* 8.7* 9.0  MG  --   --  2.0  AST  --   --  34  ALT  --   --  35  ALKPHOS  --   --  123  BILITOT  --   --  0.8    ------------------------------------------------------------------------------------------------------------------ No results for input(s): CHOL, HDL, LDLCALC, TRIG, CHOLHDL, LDLDIRECT in the last 72 hours.  No results found for: HGBA1C ------------------------------------------------------------------------------------------------------------------ No results for input(s): TSH, T4TOTAL, T3FREE, THYROIDAB in the last 72 hours.  Invalid input(s): FREET3 ------------------------------------------------------------------------------------------------------------------ Recent Labs    05/27/19 0305  FERRITIN 169    Coagulation profile No results for input(s): INR, PROTIME in the last 168 hours.  Recent Labs    05/26/19 0602 05/27/19 0305  DDIMER 1.43* 1.15*    Cardiac Enzymes No results for input(s): CKMB, TROPONINI, MYOGLOBIN in the last 168 hours.  Invalid input(s): CK ------------------------------------------------------------------------------------------------------------------ No results found for: BNP  Micro Results No results found for this or any previous visit (from the past 240 hour(s)).  Radiology Reports DG Chest Portable 1 View  Result Date: 05/25/2019 CLINICAL DATA:  58 year old female with chest pain. Positive COVID-19. EXAM: PORTABLE CHEST 1 VIEW COMPARISON:  Chest radiograph dated 12/30/2013 and CT dated 05/07/2008. FINDINGS: Bilateral peripheral and subpleural densities most consistent with multifocal pneumonia, possibly atypical or viral in etiology. Clinical correlation is recommended. No pleural effusion or pneumothorax. Stable cardiac silhouette. There is scoliosis. Partially visualized lower cervical ACDF. No acute osseous pathology. IMPRESSION: Multifocal pneumonia, possibly atypical or viral in etiology. Clinical correlation is recommended. Electronically Signed   By: 01/01/2014 M.D.   On: 05/25/2019 22:21

## 2019-05-27 NOTE — Progress Notes (Signed)
At [place], pt unable to become comfortable, bath given w/this RN supervision, pt safe and stable in tub. Moved to recliner, now giving Valium for above sx's. Will monitor effectiveness.

## 2019-05-27 NOTE — Progress Notes (Signed)
Spoke to patient's husband and gave update on condition and answered all questions.

## 2019-05-27 NOTE — Progress Notes (Signed)
Charge notes 05/27/19  Patient A&OX4.  Currently on RA and sating 93%.  Patient up ad lib to BR with no issues and no complaints.  Patient bathed.  Patient repositions self.  Anxious.  Patient received two doses of remdesavir so far. Husband updated on condition.  Appetite is good.  No major events.

## 2019-05-28 LAB — CBC WITH DIFFERENTIAL/PLATELET
Abs Immature Granulocytes: 0.13 10*3/uL — ABNORMAL HIGH (ref 0.00–0.07)
Basophils Absolute: 0 10*3/uL (ref 0.0–0.1)
Basophils Relative: 0 %
Eosinophils Absolute: 0 10*3/uL (ref 0.0–0.5)
Eosinophils Relative: 0 %
HCT: 37.6 % (ref 36.0–46.0)
Hemoglobin: 12.4 g/dL (ref 12.0–15.0)
Immature Granulocytes: 1 %
Lymphocytes Relative: 6 %
Lymphs Abs: 1.2 10*3/uL (ref 0.7–4.0)
MCH: 31.7 pg (ref 26.0–34.0)
MCHC: 33 g/dL (ref 30.0–36.0)
MCV: 96.2 fL (ref 80.0–100.0)
Monocytes Absolute: 0.8 10*3/uL (ref 0.1–1.0)
Monocytes Relative: 4 %
Neutro Abs: 16.1 10*3/uL — ABNORMAL HIGH (ref 1.7–7.7)
Neutrophils Relative %: 89 %
Platelets: 499 10*3/uL — ABNORMAL HIGH (ref 150–400)
RBC: 3.91 MIL/uL (ref 3.87–5.11)
RDW: 11.8 % (ref 11.5–15.5)
WBC: 18.2 10*3/uL — ABNORMAL HIGH (ref 4.0–10.5)
nRBC: 0 % (ref 0.0–0.2)

## 2019-05-28 LAB — COMPREHENSIVE METABOLIC PANEL
ALT: 53 U/L — ABNORMAL HIGH (ref 0–44)
AST: 48 U/L — ABNORMAL HIGH (ref 15–41)
Albumin: 3.2 g/dL — ABNORMAL LOW (ref 3.5–5.0)
Alkaline Phosphatase: 131 U/L — ABNORMAL HIGH (ref 38–126)
Anion gap: 13 (ref 5–15)
BUN: 24 mg/dL — ABNORMAL HIGH (ref 6–20)
CO2: 26 mmol/L (ref 22–32)
Calcium: 9.7 mg/dL (ref 8.9–10.3)
Chloride: 98 mmol/L (ref 98–111)
Creatinine, Ser: 0.96 mg/dL (ref 0.44–1.00)
GFR calc Af Amer: >60 mL/min (ref >60)
GFR calc non Af Amer: >60 mL/min (ref >60)
Glucose, Bld: 124 mg/dL — ABNORMAL HIGH (ref 70–99)
Potassium: 4.9 mmol/L (ref 3.5–5.1)
Sodium: 137 mmol/L (ref 135–145)
Total Bilirubin: 0.4 mg/dL (ref 0.3–1.2)
Total Protein: 7.4 g/dL (ref 6.5–8.1)

## 2019-05-28 LAB — FERRITIN: Ferritin: 207 ng/mL (ref 11–307)

## 2019-05-28 LAB — C-REACTIVE PROTEIN: CRP: 10.8 mg/dL — ABNORMAL HIGH (ref <1.0)

## 2019-05-28 LAB — D-DIMER, QUANTITATIVE: D-Dimer, Quant: 1.2 ug/mL-FEU — ABNORMAL HIGH (ref 0.00–0.50)

## 2019-05-28 LAB — MAGNESIUM: Magnesium: 1.9 mg/dL (ref 1.7–2.4)

## 2019-05-28 LAB — PHOSPHORUS: Phosphorus: 4.1 mg/dL (ref 2.5–4.6)

## 2019-05-28 MED ORDER — FUROSEMIDE 10 MG/ML IJ SOLN
40.0000 mg | Freq: Once | INTRAMUSCULAR | Status: AC
  Administered 2019-05-28: 40 mg via INTRAVENOUS
  Filled 2019-05-28: qty 4

## 2019-05-28 MED ORDER — BENZONATATE 100 MG PO CAPS
200.0000 mg | ORAL_CAPSULE | Freq: Three times a day (TID) | ORAL | Status: DC
  Administered 2019-05-28 – 2019-05-30 (×7): 200 mg via ORAL
  Filled 2019-05-28 (×7): qty 2

## 2019-05-28 MED ORDER — HYDROCOD POLST-CPM POLST ER 10-8 MG/5ML PO SUER
5.0000 mL | Freq: Two times a day (BID) | ORAL | Status: DC | PRN
  Administered 2019-05-28: 5 mL via ORAL
  Filled 2019-05-28: qty 5

## 2019-05-28 NOTE — Progress Notes (Signed)
PHYSICAL THERAPY EVALUATION  HISTORY OF CURRENT ILLNESS: Patient is a 58 y.o. female with PMHx of HTN, fibromyalgia, scoliosis-who presented with nausea, vomiting and shortness of breath-she was found to have acute hypoxic respiratory failure secondary to COVID-19 pneumonia. Patient was diagnosed with influenza B and Covid at her PCPs office on 12/11.  CLINICAL IMPRESSION: Pt admitted with above diagnosis. PTA was very independent and working. Pt currently with functional limitations due to the deficits listed below (see PT Problem List). Pt was noted to be coughing a lot during assessment and mobility, non productive. Pt was able to get around room with mod I and ambulate in hall approx 224ft with no assistive device and SBA, pt was on 2L/min throughout session and was noted to desat to a minimum of 84% while ambulating. Pt needed standing rest break and pursed lip breathing to recover 02 saturations prior to completing ambulation back to room. Pt will benefit from skilled PT while in hospital to increase her overall activity tolerance, independence and safety with mobility to allow discharge to the venue listed below. At discharge from hospital pt may go home with family.   05/28/19 0900  PT Visit Information  Last PT Received On 05/28/19  Assistance Needed +1  History of Present Illness Patient is a 58 y.o. female with PMHx of HTN, fibromyalgia, scoliosis-who presented with nausea, vomiting and shortness of breath-she was found to have acute hypoxic respiratory failure secondary to COVID-19 pneumonia. Patient was diagnosed with influenza B and Covid at her PCPs office on 12/11.  Precautions  Precautions None  Restrictions  Weight Bearing Restrictions No  Home Living  Family/patient expects to be discharged to: Private residence  Living Arrangements Spouse/significant other  Type of [place] to enter  Entrance Stairs-Number of Steps 1  [place] or work area in  basement;One level  Bathroom [employer] certified [employer] Handicapped height  Bathroom Accessibility No  Home Equipment Grab bars - toilet;Grab bars - tub/shower;Walker - 2 wheels;Cane - single point;BSC  Prior Function  Level of Independence Independent  Communication  Communication No difficulties  Pain Assessment  Pain Assessment No/denies pain  Cognition  Arousal/Alertness Awake/alert  Behavior During Therapy WFL for tasks assessed/performed  Overall Cognitive Status Within Functional Limits for tasks assessed  Upper Extremity Assessment  Upper Extremity Assessment Overall WFL for tasks assessed  Lower Extremity Assessment  Lower Extremity Assessment Overall WFL for tasks assessed  Cervical / Trunk Assessment  Cervical / Trunk Assessment Normal  Bed Mobility  Overal bed mobility Modified Independent  Transfers  Overall transfer level Modified independent  Equipment used None  General transfer comment able to get from bed to rest room and back w/ mod I  Ambulation/Gait  Ambulation/Gait assistance Supervision  Gait Distance (Feet) 250 Feet  Assistive device None  Gait Pattern/deviations Step-through pattern  General Gait Details ambulated with 2L/min via [place] pt did briefly desat with ambulation to min 84% but with stopping and cues to pursed lip breathe pt able to recover saturations to high 80s.  Balance  Overall balance assessment No apparent balance deficits (not formally assessed)  PT - End of Session  Equipment Utilized During Treatment Oxygen  Activity Tolerance Patient tolerated treatment well  Patient left in bed;with call bell/phone within reach  Nurse Communication Mobility status  PT Assessment  PT Recommendation/Assessment Patient needs continued PT services  PT Visit Diagnosis Other abnormalities of gait and mobility (R26.89)  PT Problem List Decreased  strength;Decreased activity tolerance  PT Plan  PT Frequency (ACUTE ONLY) Min 3X/week  PT  Treatment/Interventions (ACUTE ONLY) Gait training;Functional mobility training;Therapeutic activities;Therapeutic exercise;Neuromuscular re-education  AM-PAC PT "6 Clicks" Mobility Outcome Measure (Version 2)  Help needed turning from your back to your side while in a flat bed without using bedrails? 4  Help needed moving from lying on your back to sitting on the side of a flat bed without using bedrails? 4  Help needed moving to and from a bed to a chair (including a wheelchair)? 4  Help needed standing up from a chair using your arms (e.g., wheelchair or bedside chair)? 4  Help needed to walk in hospital room? 3  Help needed climbing 3-5 steps with a railing?  3  6 Click Score 22  Consider Recommendation of Discharge To: Home with no services  PT Recommendation  Follow Up Recommendations No PT follow up  PT equipment None recommended by PT  Individuals Consulted  Consulted and Agree with Results and Recommendations Patient  Acute Rehab PT Goals  Patient Stated Goal to go home   PT Goal Formulation With patient  Time For Goal Achievement 06/11/19  Potential to Achieve Goals Good  PT Time Calculation  PT Start Time (ACUTE ONLY) 0911  PT Stop Time (ACUTE ONLY) 0932  PT Time Calculation (min) (ACUTE ONLY) 21 min  PT General Charges  $$ ACUTE PT VISIT 1 Visit  PT Evaluation  $PT Eval Low Complexity 1 Low  Written Expression  Dominant Hand Right    Drema Pry, PT

## 2019-05-28 NOTE — Progress Notes (Signed)
PROGRESS NOTE                                                                                                                                                                                                             Patient Demographics:    Stephanie Cobb, is a 58 y.o. female, DOB - 1960/08/12, [id number]  Outpatient Primary MD for the patient is Lowella Dandy, NP   Admit date - 05/25/2019   LOS - 2  Chief Complaint  Patient presents with  . Influenza  . Chest Pain       Brief Narrative: Patient is a 58 y.o. female with PMHx of HTN, fibromyalgia, scoliosis-who presented with nausea, vomiting and shortness of breath-she was found to have acute hypoxic respiratory failure secondary to COVID-19 pneumonia.    Per H&P-patient was diagnosed with influenza B and Covid at her PCPs office on 12/11.   Subjective:    Stephanie Cobb has no major issues-she was lying comfortably in bed.   Assessment  & Plan :   Acute Hypoxic Resp Failure due to Covid 19 Viral pneumonia: Remains stable on just 2 L of oxygen-RN working with her to see if she can be titrated to room air.  Her CRP is finally coming down.  Continue steroids and remdesivir.    Suspect at this point does not require Actemra unless she deteriorates significantly-please note-I have already discussed rationale/risks/benefits of Actemra in case she deteriorates.  She does not have a history of TB or hepatitis B.   Fever: afebrile  O2 requirements:  SpO2: 98 % O2 Flow Rate (L/min): 2 L/min   COVID-19 Labs: Recent Labs    05/26/19 0602 05/27/19 0305 05/28/19 0315  DDIMER 1.43* 1.15* 1.20*  FERRITIN  --  169 207  LDH 233*  --   --   CRP 27.9* 21.0* 10.8*    No results found for: BNP  Recent Labs  Lab 05/26/19 0602  PROCALCITON <0.10    No results found for: SARSCOV2NAA   COVID-19 Medications: Steroids: 12/21>> Remdesivir: 12/21>>  Other  medications: Diuretics:Euvolemic-repeat Lasix 40 mg IV x1 to maintain negative balance. Antibiotics:Not needed as no evidence of bacterial infection  Prone/Incentive Spirometry: encouraged incentive spirometry use 3-4/hour.  DVT Prophylaxis  :  Lovenox   Influenza B: Per prior notes-appears to have completed a course of  Tamiflu.  HTN: Controlled-continue ramipril  Hypothyroidism: Continue Synthroid  Fibromyalgia/scoliosis/chronic pain: Controlled-continue Soma and Neurontin.  Consults  :  None  Procedures  :  None  ABG: No results found for: PHART, PCO2ART, PO2ART, HCO3, TCO2, ACIDBASEDEF, O2SAT  Vent Settings: N/A   Condition - Stable  Family Communication  :  spouse updated over the phone  Code Status :  Full Code  Diet :  Diet Order            Diet Heart Room service appropriate? Yes; Fluid consistency: Thin  Diet effective now               Disposition Plan  :  Remain hospitalized  Barriers to discharge: Hypoxia requiring O2 supplementation/complete 5 days of IV Remdesivir  Antimicorbials  :    Anti-infectives (From admission, onward)   Start     Dose/Rate Route Frequency Ordered Stop   05/27/19 1000  remdesivir 100 mg in sodium chloride 0.9 % 100 mL IVPB     100 mg 200 mL/hr over 30 Minutes Intravenous Daily 05/26/19 0119 05/31/19 0959   05/26/19 0130  remdesivir 200 mg in sodium chloride 0.9% 250 mL IVPB     200 mg 580 mL/hr over 30 Minutes Intravenous Once 05/26/19 0119 05/26/19 0350      Inpatient Medications  Scheduled Meds: . aspirin EC  81 mg Oral Daily  . benzonatate  200 mg Oral TID  . carisoprodol  350 mg Oral QHS  . cholecalciferol  5,000 Units Oral Daily  . dexamethasone (DECADRON) injection  6 mg Intravenous Q12H  . enoxaparin (LOVENOX) injection  40 mg Subcutaneous Q24H  . gabapentin  300 mg Oral QID  . levothyroxine  75 mcg Oral q morning - 10a  . montelukast  10 mg Oral q morning - 10a  . pantoprazole  40 mg Oral QHS  .  pneumococcal 23 valent vaccine  0.5 mL Intramuscular Tomorrow-1000  . ramipril  5 mg Oral BID  . sodium chloride flush  3 mL Intravenous Once   Continuous Infusions: . remdesivir 100 mg in NS 100 mL 100 mg (05/28/19 1025)   PRN Meds:.acetaminophen, chlorpheniramine-HYDROcodone, diazepam, ondansetron (ZOFRAN) IV   Time Spent in minutes  25   See all Orders from today for further details   Jeoffrey MassedShanker Mikeria Valin M.D on 05/28/2019 at 2:44 PM  To page go to [email or web address] - use universal password  [facility] -  Office  [phone]    Objective:   Vitals:   05/28/19 0500 05/28/19 0800 05/28/19 0900 05/28/19 1200  BP:  114/71    Pulse: 76 69 77 87  Resp: (!) 32 20 18 (!) 21  Temp:  98.1 F (36.7 C)  97.7 F (36.5 C)  TempSrc:  Oral  Oral  SpO2: (!) 88% 93% 97% 98%  Weight:      Height:        Wt Readings from Last 3 Encounters:  05/25/19 80.7 kg  02/23/14 76.6 kg  02/22/14 76.6 kg     Intake/Output Summary (Last 24 hours) at 05/28/2019 1444 Last data filed at 05/28/2019 1100 Gross per 24 hour  Intake 1120 ml  Output --  Net 1120 ml     Physical Exam Gen Exam:Alert awake-not in any distress HEENT:atraumatic, normocephalic Chest: B/L clear to auscultation anteriorly CVS:S1S2 regular Abdomen:soft non tender, non distended Extremities:no edema Neurology: Non focal Skin: no rash   Data Review:    CBC Recent Labs  Lab 05/25/19 2032 05/27/19 0305  05/28/19 0315  WBC 8.5 11.2* 18.2*  HGB 11.5* 10.9* 12.4  HCT 34.1* 33.1* 37.6  PLT 275 344 499*  MCV 95.5 95.4 96.2  MCH 32.2 31.4 31.7  MCHC 33.7 32.9 33.0  RDW 11.5 11.7 11.8  LYMPHSABS  --  0.6* 1.2  MONOABS  --  0.5 0.8  EOSABS  --  0.0 0.0  BASOSABS  --  0.0 0.0    Chemistries  Recent Labs  Lab 05/25/19 2032 05/26/19 1232 05/27/19 0305 05/28/19 0315  NA 130* 137 135 137  K 5.0 5.4* 4.9 4.9  CL 97* 101 99 98  CO2 24 25 28 26   GLUCOSE 105* 127* 162* 124*  BUN 14 9 20  24*  CREATININE  1.16* 1.04* 0.89 0.96  CALCIUM 8.3* 8.7* 9.0 9.7  MG  --   --  2.0 1.9  AST  --   --  34 48*  ALT  --   --  35 53*  ALKPHOS  --   --  123 131*  BILITOT  --   --  0.8 0.4   ------------------------------------------------------------------------------------------------------------------ No results for input(s): CHOL, HDL, LDLCALC, TRIG, CHOLHDL, LDLDIRECT in the last 72 hours.  No results found for: HGBA1C ------------------------------------------------------------------------------------------------------------------ No results for input(s): TSH, T4TOTAL, T3FREE, THYROIDAB in the last 72 hours.  Invalid input(s): FREET3 ------------------------------------------------------------------------------------------------------------------ Recent Labs    05/27/19 0305 05/28/19 0315  FERRITIN 169 207    Coagulation profile No results for input(s): INR, PROTIME in the last 168 hours.  Recent Labs    05/27/19 0305 05/28/19 0315  DDIMER 1.15* 1.20*    Cardiac Enzymes No results for input(s): CKMB, TROPONINI, MYOGLOBIN in the last 168 hours.  Invalid input(s): CK ------------------------------------------------------------------------------------------------------------------ No results found for: BNP  Micro Results No results found for this or any previous visit (from the past 240 hour(s)).  Radiology Reports DG Chest Portable 1 View  Result Date: 05/25/2019 CLINICAL DATA:  58 year old female with chest pain. Positive COVID-19. EXAM: PORTABLE CHEST 1 VIEW COMPARISON:  Chest radiograph dated 12/30/2013 and CT dated 05/07/2008. FINDINGS: Bilateral peripheral and subpleural densities most consistent with multifocal pneumonia, possibly atypical or viral in etiology. Clinical correlation is recommended. No pleural effusion or pneumothorax. Stable cardiac silhouette. There is scoliosis. Partially visualized lower cervical ACDF. No acute osseous pathology. IMPRESSION: Multifocal  pneumonia, possibly atypical or viral in etiology. Clinical correlation is recommended. Electronically Signed   By: 01/01/2014 M.D.   On: 05/25/2019 22:21

## 2019-05-28 NOTE — Progress Notes (Signed)
Pts c/o "hot", facial flushing, valium given hour prior, BP high150's/120's, NSR 89, RR 22, note to MD regarding order for added meds, BP med (altace at HS only) Await response.

## 2019-05-29 LAB — COMPREHENSIVE METABOLIC PANEL
ALT: 80 U/L — ABNORMAL HIGH (ref 0–44)
AST: 42 U/L — ABNORMAL HIGH (ref 15–41)
Albumin: 3.2 g/dL — ABNORMAL LOW (ref 3.5–5.0)
Alkaline Phosphatase: 87 U/L (ref 38–126)
Anion gap: 13 (ref 5–15)
BUN: 30 mg/dL — ABNORMAL HIGH (ref 6–20)
CO2: 29 mmol/L (ref 22–32)
Calcium: 9.2 mg/dL (ref 8.9–10.3)
Chloride: 93 mmol/L — ABNORMAL LOW (ref 98–111)
Creatinine, Ser: 0.97 mg/dL (ref 0.44–1.00)
GFR calc Af Amer: >60 mL/min (ref >60)
GFR calc non Af Amer: >60 mL/min (ref >60)
Glucose, Bld: 108 mg/dL — ABNORMAL HIGH (ref 70–99)
Potassium: 4.2 mmol/L (ref 3.5–5.1)
Sodium: 135 mmol/L (ref 135–145)
Total Bilirubin: 0.4 mg/dL (ref 0.3–1.2)
Total Protein: 7 g/dL (ref 6.5–8.1)

## 2019-05-29 LAB — CBC WITH DIFFERENTIAL/PLATELET
Abs Immature Granulocytes: 0.14 10*3/uL — ABNORMAL HIGH (ref 0.00–0.07)
Basophils Absolute: 0 10*3/uL (ref 0.0–0.1)
Basophils Relative: 0 %
Eosinophils Absolute: 0 10*3/uL (ref 0.0–0.5)
Eosinophils Relative: 0 %
HCT: 36.6 % (ref 36.0–46.0)
Hemoglobin: 12.3 g/dL (ref 12.0–15.0)
Immature Granulocytes: 1 %
Lymphocytes Relative: 9 %
Lymphs Abs: 1.2 10*3/uL (ref 0.7–4.0)
MCH: 31.6 pg (ref 26.0–34.0)
MCHC: 33.6 g/dL (ref 30.0–36.0)
MCV: 94.1 fL (ref 80.0–100.0)
Monocytes Absolute: 0.7 10*3/uL (ref 0.1–1.0)
Monocytes Relative: 5 %
Neutro Abs: 11.5 10*3/uL — ABNORMAL HIGH (ref 1.7–7.7)
Neutrophils Relative %: 85 %
Platelets: 525 10*3/uL — ABNORMAL HIGH (ref 150–400)
RBC: 3.89 MIL/uL (ref 3.87–5.11)
RDW: 11.6 % (ref 11.5–15.5)
WBC: 13.6 10*3/uL — ABNORMAL HIGH (ref 4.0–10.5)
nRBC: 0 % (ref 0.0–0.2)

## 2019-05-29 LAB — C-REACTIVE PROTEIN: CRP: 5 mg/dL — ABNORMAL HIGH (ref <1.0)

## 2019-05-29 LAB — D-DIMER, QUANTITATIVE: D-Dimer, Quant: 0.89 ug/mL-FEU — ABNORMAL HIGH (ref 0.00–0.50)

## 2019-05-29 LAB — FERRITIN: Ferritin: 132 ng/mL (ref 11–307)

## 2019-05-29 NOTE — Progress Notes (Signed)
Spoke with patients husband and gave update and answered all questions.

## 2019-05-29 NOTE — Progress Notes (Signed)
Pt had uneventful night, No conmplaints of pain, bath done earlier in PM. Slept throughout night well, VSS. Pt awaits P.T. today to work with her towards possible d/c soon. Appetite good, moving bowels, urinating w/o concerns.  P ox high 90's on 2LNC during sleep, once awake at RA sats mid 90's, w/o complaints of SOB. Pt has history of anxiety which does seem to play a part in her BP's and Sat capacity. Report to Karie Kirks RN, unremarkable pt, stable at time of this RN's departure from unit.

## 2019-05-29 NOTE — Progress Notes (Signed)
PROGRESS NOTE                                                                                                                                                                                                             Patient Demographics:    Stephanie Cobb, is a 58 y.o. female, DOB - 1961/02/03, [id number]  Outpatient Primary MD for the patient is Lowella Dandy, NP   Admit date - 05/25/2019   LOS - 3  Chief Complaint  Patient presents with  . Influenza  . Chest Pain       Brief Narrative: Patient is a 58 y.o. female with PMHx of HTN, fibromyalgia, scoliosis-who presented with nausea, vomiting and shortness of breath-she was found to have acute hypoxic respiratory failure secondary to COVID-19 pneumonia.    Per H&P-patient was diagnosed with influenza B and Covid at her PCPs office on 12/11.   Subjective:    Stephanie Cobb continues to improve-has been titrated to room air.   Assessment  & Plan :   Acute Hypoxic Resp Failure due to Covid 19 Viral pneumonia: Improved-titrated to room air-CRP continues to downtrend.  Continue steroids and remdesivir.  Given clinical improvement-does not require Actemra.    Fever: afebrile  O2 requirements:  SpO2: 94 % O2 Flow Rate (L/min): 2 L/min   COVID-19 Labs: Recent Labs    05/27/19 0305 05/28/19 0315 05/29/19 0540  DDIMER 1.15* 1.20* 0.89*  FERRITIN 169 207 132  CRP 21.0* 10.8* 5.0*    No results found for: BNP  Recent Labs  Lab 05/26/19 0602  PROCALCITON <0.10    No results found for: SARSCOV2NAA   COVID-19 Medications: Steroids: 12/21>> Remdesivir: 12/21>>  Other medications: Diuretics:Euvolemic-repeat Lasix 40 mg IV x1 to maintain negative balance. Antibiotics:Not needed as no evidence of bacterial infection  Prone/Incentive Spirometry: encouraged incentive spirometry use 3-4/hour.  DVT Prophylaxis  :  Lovenox   Influenza B: Per prior  notes-appears to have completed a course of Tamiflu.  HTN: Controlled-continue ramipril  Hypothyroidism: Continue Synthroid  Fibromyalgia/scoliosis/chronic pain: Controlled-continue Soma and Neurontin.  Consults  :  None  Procedures  :  None  ABG: No results found for: PHART, PCO2ART, PO2ART, HCO3, TCO2, ACIDBASEDEF, O2SAT  Vent Settings: N/A   Condition - Stable  Family Communication  :  spouse updated over the phone on 12/25  Code Status :  Full Code  Diet :  Diet Order            Diet Heart Room service appropriate? Yes; Fluid consistency: Thin  Diet effective now               Disposition Plan  :  Remain hospitalized-but if clinical improvement continues-Home on 12/26  Barriers to discharge: Hypoxia requiring O2 supplementation/complete 5 days of IV Remdesivir  Antimicorbials  :    Anti-infectives (From admission, onward)   Start     Dose/Rate Route Frequency Ordered Stop   05/27/19 1000  remdesivir 100 mg in sodium chloride 0.9 % 100 mL IVPB     100 mg 200 mL/hr over 30 Minutes Intravenous Daily 05/26/19 0119 05/31/19 0959   05/26/19 0130  remdesivir 200 mg in sodium chloride 0.9% 250 mL IVPB     200 mg 580 mL/hr over 30 Minutes Intravenous Once 05/26/19 0119 05/26/19 0350      Inpatient Medications  Scheduled Meds: . aspirin EC  81 mg Oral Daily  . benzonatate  200 mg Oral TID  . carisoprodol  350 mg Oral QHS  . cholecalciferol  5,000 Units Oral Daily  . dexamethasone (DECADRON) injection  6 mg Intravenous Q12H  . enoxaparin (LOVENOX) injection  40 mg Subcutaneous Q24H  . gabapentin  300 mg Oral QID  . levothyroxine  75 mcg Oral q morning - 10a  . montelukast  10 mg Oral q morning - 10a  . pantoprazole  40 mg Oral QHS  . pneumococcal 23 valent vaccine  0.5 mL Intramuscular Tomorrow-1000  . ramipril  5 mg Oral BID  . sodium chloride flush  3 mL Intravenous Once   Continuous Infusions: . remdesivir 100 mg in NS 100 mL 100 mg (05/29/19 1209)    PRN Meds:.acetaminophen, chlorpheniramine-HYDROcodone, diazepam, ondansetron (ZOFRAN) IV   Time Spent in minutes  25   See all Orders from today for further details   Jeoffrey Massed M.D on 05/29/2019 at 2:31 PM  To page go to [email or web address] - use universal password  [facility] -  Office  [phone]    Objective:   Vitals:   05/29/19 0300 05/29/19 0400 05/29/19 0738 05/29/19 1223  BP:  (!) 149/90 (!) 148/99 129/74  Pulse: 81 69 84 82  Resp: (!) 25 20 (!) 21 (!) 22  Temp:   98.4 F (36.9 C) 98.4 F (36.9 C)  TempSrc:  Axillary Oral Oral  SpO2: (!) 88% 90% 93% 94%  Weight:      Height:        Wt Readings from Last 3 Encounters:  05/25/19 80.7 kg  02/23/14 76.6 kg  02/22/14 76.6 kg     Intake/Output Summary (Last 24 hours) at 05/29/2019 1431 Last data filed at 05/29/2019 0115 Gross per 24 hour  Intake 880 ml  Output 1250 ml  Net -370 ml     Physical Exam Gen Exam:Alert awake-not in any distress HEENT:atraumatic, normocephalic Chest: B/L clear to auscultation anteriorly CVS:S1S2 regular Abdomen:soft non tender, non distended Extremities:no edema Neurology: Non focal Skin: no rash   Data Review:    CBC Recent Labs  Lab 05/25/19 2032 05/27/19 0305 05/28/19 0315 05/29/19 0540  WBC 8.5 11.2* 18.2* 13.6*  HGB 11.5* 10.9* 12.4 12.3  HCT 34.1* 33.1* 37.6 36.6  PLT 275 344 499* 525*  MCV 95.5 95.4 96.2 94.1  MCH 32.2 31.4 31.7 31.6  MCHC 33.7 32.9 33.0 33.6  RDW 11.5 11.7 11.8 11.6  LYMPHSABS  --  0.6* 1.2 1.2  MONOABS  --  0.5 0.8 0.7  EOSABS  --  0.0 0.0 0.0  BASOSABS  --  0.0 0.0 0.0    Chemistries  Recent Labs  Lab 05/25/19 2032 05/26/19 1232 05/27/19 0305 05/28/19 0315 05/29/19 0540  NA 130* 137 135 137 135  K 5.0 5.4* 4.9 4.9 4.2  CL 97* 101 99 98 93*  CO2 24 25 28 26 29   GLUCOSE 105* 127* 162* 124* 108*  BUN 14 9 20  24* 30*  CREATININE 1.16* 1.04* 0.89 0.96 0.97  CALCIUM 8.3* 8.7* 9.0 9.7 9.2  MG  --   --  2.0  1.9  --   AST  --   --  34 48* 42*  ALT  --   --  35 53* 80*  ALKPHOS  --   --  123 131* 87  BILITOT  --   --  0.8 0.4 0.4   ------------------------------------------------------------------------------------------------------------------ No results for input(s): CHOL, HDL, LDLCALC, TRIG, CHOLHDL, LDLDIRECT in the last 72 hours.  No results found for: HGBA1C ------------------------------------------------------------------------------------------------------------------ No results for input(s): TSH, T4TOTAL, T3FREE, THYROIDAB in the last 72 hours.  Invalid input(s): FREET3 ------------------------------------------------------------------------------------------------------------------ Recent Labs    05/28/19 0315 05/29/19 0540  FERRITIN 207 132    Coagulation profile No results for input(s): INR, PROTIME in the last 168 hours.  Recent Labs    05/28/19 0315 05/29/19 0540  DDIMER 1.20* 0.89*    Cardiac Enzymes No results for input(s): CKMB, TROPONINI, MYOGLOBIN in the last 168 hours.  Invalid input(s): CK ------------------------------------------------------------------------------------------------------------------ No results found for: BNP  Micro Results No results found for this or any previous visit (from the past 240 hour(s)).  Radiology Reports DG Chest Portable 1 View  Result Date: 05/25/2019 CLINICAL DATA:  58 year old female with chest pain. Positive COVID-19. EXAM: PORTABLE CHEST 1 VIEW COMPARISON:  Chest radiograph dated 12/30/2013 and CT dated 05/07/2008. FINDINGS: Bilateral peripheral and subpleural densities most consistent with multifocal pneumonia, possibly atypical or viral in etiology. Clinical correlation is recommended. No pleural effusion or pneumothorax. Stable cardiac silhouette. There is scoliosis. Partially visualized lower cervical ACDF. No acute osseous pathology. IMPRESSION: Multifocal pneumonia, possibly atypical or viral in etiology.  Clinical correlation is recommended. Electronically Signed   By: Elgie CollardArash  Radparvar M.D.   On: 05/25/2019 22:21

## 2019-05-30 DIAGNOSIS — R0902 Hypoxemia: Secondary | ICD-10-CM

## 2019-05-30 LAB — COMPREHENSIVE METABOLIC PANEL
ALT: 66 U/L — ABNORMAL HIGH (ref 0–44)
AST: 29 U/L (ref 15–41)
Albumin: 3.2 g/dL — ABNORMAL LOW (ref 3.5–5.0)
Alkaline Phosphatase: 118 U/L (ref 38–126)
Anion gap: 14 (ref 5–15)
BUN: 31 mg/dL — ABNORMAL HIGH (ref 6–20)
CO2: 27 mmol/L (ref 22–32)
Calcium: 9.1 mg/dL (ref 8.9–10.3)
Chloride: 91 mmol/L — ABNORMAL LOW (ref 98–111)
Creatinine, Ser: 0.98 mg/dL (ref 0.44–1.00)
GFR calc Af Amer: >60 mL/min (ref >60)
GFR calc non Af Amer: >60 mL/min (ref >60)
Glucose, Bld: 131 mg/dL — ABNORMAL HIGH (ref 70–99)
Potassium: 4.7 mmol/L (ref 3.5–5.1)
Sodium: 132 mmol/L — ABNORMAL LOW (ref 135–145)
Total Bilirubin: 0.2 mg/dL — ABNORMAL LOW (ref 0.3–1.2)
Total Protein: 7.4 g/dL (ref 6.5–8.1)

## 2019-05-30 LAB — CBC WITH DIFFERENTIAL/PLATELET
Abs Immature Granulocytes: 0.52 10*3/uL — ABNORMAL HIGH (ref 0.00–0.07)
Basophils Absolute: 0.1 10*3/uL (ref 0.0–0.1)
Basophils Relative: 0 %
Eosinophils Absolute: 0 10*3/uL (ref 0.0–0.5)
Eosinophils Relative: 0 %
HCT: 40.5 % (ref 36.0–46.0)
Hemoglobin: 13.3 g/dL (ref 12.0–15.0)
Immature Granulocytes: 3 %
Lymphocytes Relative: 9 %
Lymphs Abs: 1.4 10*3/uL (ref 0.7–4.0)
MCH: 31.4 pg (ref 26.0–34.0)
MCHC: 32.8 g/dL (ref 30.0–36.0)
MCV: 95.5 fL (ref 80.0–100.0)
Monocytes Absolute: 1 10*3/uL (ref 0.1–1.0)
Monocytes Relative: 6 %
Neutro Abs: 13.1 10*3/uL — ABNORMAL HIGH (ref 1.7–7.7)
Neutrophils Relative %: 82 %
Platelets: 648 10*3/uL — ABNORMAL HIGH (ref 150–400)
RBC: 4.24 MIL/uL (ref 3.87–5.11)
RDW: 11.7 % (ref 11.5–15.5)
WBC: 16 10*3/uL — ABNORMAL HIGH (ref 4.0–10.5)
nRBC: 0 % (ref 0.0–0.2)

## 2019-05-30 LAB — FERRITIN: Ferritin: 120 ng/mL (ref 11–307)

## 2019-05-30 LAB — D-DIMER, QUANTITATIVE: D-Dimer, Quant: 0.77 ug/mL-FEU — ABNORMAL HIGH (ref 0.00–0.50)

## 2019-05-30 LAB — C-REACTIVE PROTEIN: CRP: 4.7 mg/dL — ABNORMAL HIGH (ref <1.0)

## 2019-05-30 MED ORDER — DEXAMETHASONE 6 MG PO TABS: 6.0000 mg | ORAL_TABLET | Freq: Every day | ORAL | 0 refills | Status: AC

## 2019-05-30 MED ORDER — PNEUMOCOCCAL VAC POLYVALENT 25 MCG/0.5ML IJ INJ
0.5000 mL | INJECTION | INTRAMUSCULAR | Status: AC
  Administered 2019-05-30: 0.5 mL via INTRAMUSCULAR
  Filled 2019-05-30: qty 0.5

## 2019-05-30 MED ORDER — INFLUENZA VAC A&B SA ADJ QUAD 0.5 ML IM PRSY
0.5000 mL | PREFILLED_SYRINGE | INTRAMUSCULAR | Status: AC
  Administered 2019-05-30: 0.5 mL via INTRAMUSCULAR
  Filled 2019-05-30: qty 0.5

## 2019-05-30 NOTE — Discharge Instructions (Signed)
Person Under Monitoring Name: Stephanie Cobb  Location: [place] [address]   Infection Prevention Recommendations for Individuals Confirmed to have, or Being Evaluated for, 2019 Novel Coronavirus (COVID-19) Infection Who Receive Care at Home  Individuals who are confirmed to have, or are being evaluated for, COVID-19 should follow the prevention steps below until a healthcare provider or local or state health department says they can return to normal activities.  Stay home except to get medical care You should restrict activities outside your home, except for getting medical care. Do not go to work, school, or public areas, and do not use public transportation or taxis.  Call ahead before visiting your doctor Before your medical appointment, call the healthcare provider and tell them that you have, or are being evaluated for, COVID-19 infection. This will help the healthcare provider's office take steps to keep other people from getting infected. Ask your healthcare provider to call the local or state health department.  Monitor your symptoms Seek prompt medical attention if your illness is worsening (e.g., difficulty breathing). Before going to your medical appointment, call the healthcare provider and tell them that you have, or are being evaluated for, COVID-19 infection. Ask your healthcare provider to call the local or state health department.  Wear a facemask You should wear a facemask that covers your nose and mouth when you are in the same room with other people and when you visit a healthcare provider. People who live with or visit you should also wear a facemask while they are in the same room with you.  Separate yourself from other people in your home As much as possible, you should stay in a different room from other people in your home. Also, you should use a separate bathroom, if available.  Avoid sharing household items You should not share  dishes, drinking glasses, cups, eating utensils, towels, bedding, or other items with other people in your home. After using these items, you should wash them thoroughly with soap and water.  Cover your coughs and sneezes Cover your mouth and nose with a tissue when you cough or sneeze, or you can cough or sneeze into your sleeve. Throw used tissues in a lined trash can, and immediately wash your hands with soap and water for at least 20 seconds or use an alcohol-based hand rub.  Wash your [facility] your hands often and thoroughly with soap and water for at least 20 seconds. You can use an alcohol-based hand sanitizer if soap and water are not available and if your hands are not visibly dirty. Avoid touching your eyes, nose, and mouth with unwashed hands.   Prevention Steps for Caregivers and Household Members of Individuals Confirmed to have, or Being Evaluated for, COVID-19 Infection Being Cared for in the Home  If you live with, or provide care at home for, a person confirmed to have, or being evaluated for, COVID-19 infection please follow these guidelines to prevent infection:  Follow healthcare provider's instructions Make sure that you understand and can help the patient follow any healthcare provider instructions for all care.  Provide for the patient's basic needs You should help the patient with basic needs in the home and provide support for getting groceries, prescriptions, and other personal needs.  Monitor the patient's symptoms If they are getting sicker, call his or her medical provider and tell them that the patient has, or is being evaluated for, COVID-19 infection. This will help the healthcare provider's office  take steps to keep other people from getting infected. Ask the healthcare provider to call the local or state health department.  Limit the number of people who have contact with the patient  If possible, have only one caregiver for the patient.  Other  household members should stay in another home or place of residence. If this is not possible, they should stay  in another room, or be separated from the patient as much as possible. Use a separate bathroom, if available.  Restrict visitors who do not have an essential need to be in the home.  Keep older adults, very young children, and other sick people away from the patient Keep older adults, very young children, and those who have compromised immune systems or chronic health conditions away from the patient. This includes people with chronic heart, lung, or kidney conditions, diabetes, and cancer.  Ensure good ventilation Make sure that shared spaces in the home have good air flow, such as from an air conditioner or an opened window, weather permitting.  Wash your hands often  Wash your hands often and thoroughly with soap and water for at least 20 seconds. You can use an alcohol based hand sanitizer if soap and water are not available and if your hands are not visibly dirty.  Avoid touching your eyes, nose, and mouth with unwashed hands.  Use disposable paper towels to dry your hands. If not available, use dedicated cloth towels and replace them when they become wet.  Wear a facemask and gloves  Wear a disposable facemask at all times in the room and gloves when you touch or have contact with the patient's blood, body fluids, and/or secretions or excretions, such as sweat, saliva, sputum, nasal mucus, vomit, urine, or feces.  Ensure the mask fits over your nose and mouth tightly, and do not touch it during use.  Throw out disposable facemasks and gloves after using them. Do not reuse.  Wash your hands immediately after removing your facemask and gloves.  If your personal clothing becomes contaminated, carefully remove clothing and launder. Wash your hands after handling contaminated clothing.  Place all used disposable facemasks, gloves, and other waste in a lined container before  disposing them with other household waste.  Remove gloves and wash your hands immediately after handling these items.  Do not share dishes, glasses, or other household items with the patient  Avoid sharing household items. You should not share dishes, drinking glasses, cups, eating utensils, towels, bedding, or other items with a patient who is confirmed to have, or being evaluated for, COVID-19 infection.  After the person uses these items, you should wash them thoroughly with soap and water.  Wash laundry thoroughly  Immediately remove and wash clothes or bedding that have blood, body fluids, and/or secretions or excretions, such as sweat, saliva, sputum, nasal mucus, vomit, urine, or feces, on them.  Wear gloves when handling laundry from the patient.  Read and follow directions on labels of laundry or clothing items and detergent. In general, wash and dry with the warmest temperatures recommended on the label.  Clean all areas the individual has used often  Clean all touchable surfaces, such as counters, tabletops, doorknobs, bathroom fixtures, toilets, phones, keyboards, tablets, and bedside tables, every day. Also, clean any surfaces that may have blood, body fluids, and/or secretions or excretions on them.  Wear gloves when cleaning surfaces the patient has come in contact with.  Use a diluted bleach solution (e.g., dilute bleach with 1 part  bleach and 10 parts water) or a household disinfectant with a label that says EPA-registered for coronaviruses. To make a bleach solution at home, add 1 tablespoon of bleach to 1 quart (4 cups) of water. For a larger supply, add  cup of bleach to 1 gallon (16 cups) of water.  Read labels of cleaning products and follow recommendations provided on product labels. Labels contain instructions for safe and effective use of the cleaning product including precautions you should take when applying the product, such as wearing gloves or eye protection  and making sure you have good ventilation during use of the product.  Remove gloves and wash hands immediately after cleaning.  Monitor yourself for signs and symptoms of illness Caregivers and household members are considered close contacts, should monitor their health, and will be asked to limit movement outside of the home to the extent possible. Follow the monitoring steps for close contacts listed on the symptom monitoring form.   ? If you have additional questions, contact your local health department or call the epidemiologist on call at [phone] (available 24/7). ? This guidance is subject to change. For the most up-to-date guidance from [facility], please refer to their website: [email or web address]

## 2019-05-30 NOTE — TOC Progression Note (Signed)
TOC consult for DME/HH needs entered yesterday. Discussed with Charge RN this AM. At this time, pt is on room air and does not need Home O2. Pt is not recommending any PT follow up or DME. There does not appear to be any need for [facility] and MD did not order. Pt being discharged home today. No TOC needs for dc.

## 2019-05-30 NOTE — Discharge Summary (Addendum)
PATIENT DETAILS Name: Stephanie Cobb Age: 58 y.o. Sex: female Date of Birth: 14-Jan-1961 MRN: [id number]. Admitting Physician: Anselm Jungling, DO WUJ:WJXB, Amy A, NP  Admit Date: 05/25/2019 Discharge date: 05/30/2019  Recommendations for Outpatient Follow-up:  1. Follow up with PCP in 1-2 weeks 2. Please obtain CMP/CBC in one week 3. Repeat Chest Xray in 4-6 week  Admitted From:  Home  Disposition: Home   Home Health: No  Equipment/Devices: None  Discharge Condition: Stable  CODE STATUS: FULL CODE  Diet recommendation:  Diet Order            Diet - low sodium heart healthy        Diet Heart Room service appropriate? Yes; Fluid consistency: Thin  Diet effective now               Brief Summary: See H&P, Labs, Consult and Test reports for all details in brief, Patient is a 58 y.o. female with PMHx of HTN, fibromyalgia, scoliosis-who presented with nausea, vomiting and shortness of breath-she was found to have acute hypoxic respiratory failure secondary to COVID-19 pneumonia.    Per H&P-patient was diagnosed with influenza B and Covid at her PCPs office on 12/11.  Brief Hospital Course:  Acute Hypoxic Resp Failure due to Covid 19 Viral pneumonia: Improved-titrated to room air-she is ambulating around in the room without any difficulty.  She is very anxious to go home today.  CRP continues to downtrend.    Will complete 12/26-following that she will be discharged home on a steroid taper.  Ambulated in the hallway by RN on the day of discharge-does not require home O2.  COVID-19 Labs:  Recent Labs    05/28/19 0315 05/29/19 0540 05/30/19 0407  DDIMER 1.20* 0.89* 0.77*  FERRITIN 207 132 120  CRP 10.8* 5.0* 4.7*    No results found for: SARSCOV2NAA   COVID-19 Medications: Steroids: 12/21>> Remdesivir: 12/21>>12/26  Influenza B: Per prior notes-appears to have completed a course of Tamiflu.  HTN: Controlled-continue ramipril and  metoprolol  Hypothyroidism: Continue Synthroid  Fibromyalgia/scoliosis/chronic pain: Controlled-continue Soma and Neurontin.  Procedures/Studies: None  Discharge Diagnoses:  Principal Problem:   Pneumonia due to COVID-19 virus Active Problems:   Multifocal pneumonia   Fibromyalgia   Scoliosis   Depression   Anxiety   Hypothyroidism   Discharge Instructions:    Person Under Monitoring Name: Stephanie Cobb  Location: [address] [place] [address]   Infection Prevention Recommendations for Individuals Confirmed to have, or Being Evaluated for, 2019 Novel Coronavirus (COVID-19) Infection Who Receive Care at Home  Individuals who are confirmed to have, or are being evaluated for, COVID-19 should follow the prevention steps below until a healthcare provider or local or state health department says they can return to normal activities.  Stay home except to get medical care You should restrict activities outside your home, except for getting medical care. Do not go to work, school, or public areas, and do not use public transportation or taxis.  Call ahead before visiting your doctor Before your medical appointment, call the healthcare provider and tell them that you have, or are being evaluated for, COVID-19 infection. This will help the healthcare provider's office take steps to keep other people from getting infected. Ask your healthcare provider to call the local or state health department.  Monitor your symptoms Seek prompt medical attention if your illness is worsening (e.g., difficulty breathing). Before going to your medical appointment, call the healthcare provider and tell  them that you have, or are being evaluated for, COVID-19 infection. Ask your healthcare provider to call the local or state health department.  Wear a facemask You should wear a facemask that covers your nose and mouth when you are in the same room with other people and when you visit a  healthcare provider. People who live with or visit you should also wear a facemask while they are in the same room with you.  Separate yourself from other people in your home As much as possible, you should stay in a different room from other people in your home. Also, you should use a separate bathroom, if available.  Avoid sharing household items You should not share dishes, drinking glasses, cups, eating utensils, towels, bedding, or other items with other people in your home. After using these items, you should wash them thoroughly with soap and water.  Cover your coughs and sneezes Cover your mouth and nose with a tissue when you cough or sneeze, or you can cough or sneeze into your sleeve. Throw used tissues in a lined trash can, and immediately wash your hands with soap and water for at least 20 seconds or use an alcohol-based hand rub.  Wash your [employer] your hands often and thoroughly with soap and water for at least 20 seconds. You can use an alcohol-based hand sanitizer if soap and water are not available and if your hands are not visibly dirty. Avoid touching your eyes, nose, and mouth with unwashed hands.   Prevention Steps for Caregivers and Household Members of Individuals Confirmed to have, or Being Evaluated for, COVID-19 Infection Being Cared for in the Home  If you live with, or provide care at home for, a person confirmed to have, or being evaluated for, COVID-19 infection please follow these guidelines to prevent infection:  Follow healthcare provider's instructions Make sure that you understand and can help the patient follow any healthcare provider instructions for all care.  Provide for the patient's basic needs You should help the patient with basic needs in the home and provide support for getting groceries, prescriptions, and other personal needs.  Monitor the patient's symptoms If they are getting sicker, call his or her medical provider and tell  them that the patient has, or is being evaluated for, COVID-19 infection. This will help the healthcare provider's office take steps to keep other people from getting infected. Ask the healthcare provider to call the local or state health department.  Limit the number of people who have contact with the patient  If possible, have only one caregiver for the patient.  Other household members should stay in another home or place of residence. If this is not possible, they should stay  in another room, or be separated from the patient as much as possible. Use a separate bathroom, if available.  Restrict visitors who do not have an essential need to be in the home.  Keep older adults, very young children, and other sick people away from the patient Keep older adults, very young children, and those who have compromised immune systems or chronic health conditions away from the patient. This includes people with chronic heart, lung, or kidney conditions, diabetes, and cancer.  Ensure good ventilation Make sure that shared spaces in the home have good air flow, such as from an air conditioner or an opened window, weather permitting.  Wash your hands often  Wash your hands often and thoroughly with soap and water for at least 20 seconds.  You can use an alcohol based hand sanitizer if soap and water are not available and if your hands are not visibly dirty.  Avoid touching your eyes, nose, and mouth with unwashed hands.  Use disposable paper towels to dry your hands. If not available, use dedicated cloth towels and replace them when they become wet.  Wear a facemask and gloves  Wear a disposable facemask at all times in the room and gloves when you touch or have contact with the patient's blood, body fluids, and/or secretions or excretions, such as sweat, saliva, sputum, nasal mucus, vomit, urine, or feces.  Ensure the mask fits over your nose and mouth tightly, and do not touch it during  use.  Throw out disposable facemasks and gloves after using them. Do not reuse.  Wash your hands immediately after removing your facemask and gloves.  If your personal clothing becomes contaminated, carefully remove clothing and launder. Wash your hands after handling contaminated clothing.  Place all used disposable facemasks, gloves, and other waste in a lined container before disposing them with other household waste.  Remove gloves and wash your hands immediately after handling these items.  Do not share dishes, glasses, or other household items with the patient  Avoid sharing household items. You should not share dishes, drinking glasses, cups, eating utensils, towels, bedding, or other items with a patient who is confirmed to have, or being evaluated for, COVID-19 infection.  After the person uses these items, you should wash them thoroughly with soap and water.  Wash laundry thoroughly  Immediately remove and wash clothes or bedding that have blood, body fluids, and/or secretions or excretions, such as sweat, saliva, sputum, nasal mucus, vomit, urine, or feces, on them.  Wear gloves when handling laundry from the patient.  Read and follow directions on labels of laundry or clothing items and detergent. In general, wash and dry with the warmest temperatures recommended on the label.  Clean all areas the individual has used often  Clean all touchable surfaces, such as counters, tabletops, doorknobs, bathroom fixtures, toilets, phones, keyboards, tablets, and bedside tables, every day. Also, clean any surfaces that may have blood, body fluids, and/or secretions or excretions on them.  Wear gloves when cleaning surfaces the patient has come in contact with.  Use a diluted bleach solution (e.g., dilute bleach with 1 part bleach and 10 parts water) or a household disinfectant with a label that says EPA-registered for coronaviruses. To make a bleach solution at home, add 1 tablespoon  of bleach to 1 quart (4 cups) of water. For a larger supply, add  cup of bleach to 1 gallon (16 cups) of water.  Read labels of cleaning products and follow recommendations provided on product labels. Labels contain instructions for safe and effective use of the cleaning product including precautions you should take when applying the product, such as wearing gloves or eye protection and making sure you have good ventilation during use of the product.  Remove gloves and wash hands immediately after cleaning.  Monitor yourself for signs and symptoms of illness Caregivers and household members are considered close contacts, should monitor their health, and will be asked to limit movement outside of the home to the extent possible. Follow the monitoring steps for close contacts listed on the symptom monitoring form.   ? If you have additional questions, contact your local health department or call the epidemiologist on call at [phone] (available 24/7). ? This guidance is subject to change. For the most up-to-date  guidance from [facility], please refer to their website: YouBlogs.pl    Activity:  As tolerated  Discharge Instructions    Call MD for:  difficulty breathing, headache or visual disturbances   Complete by: As directed    Call MD for:  extreme fatigue   Complete by: As directed    Call MD for:  persistant dizziness or light-headedness   Complete by: As directed    Diet - low sodium heart healthy   Complete by: As directed    Discharge instructions   Complete by: As directed    Follow with Primary MD  Manson Allan, Amy A, NP in 1-2 weeks  Please get a complete blood count and chemistry panel checked by your Primary MD at your next visit, and again as instructed by your Primary MD.  Get Medicines reviewed and adjusted: Please take all your medications with you for your next visit with your Primary  MD  Laboratory/radiological data: Please request your Primary MD to go over all hospital tests and procedure/radiological results at the follow up, please ask your Primary MD to get all Hospital records sent to his/her office.  In some cases, they will be blood work, cultures and biopsy results pending at the time of your discharge. Please request that your primary care M.D. follows up on these results.  Also Note the following: If you experience worsening of your admission symptoms, develop shortness of breath, life threatening emergency, suicidal or homicidal thoughts you must seek medical attention immediately by calling 911 or calling your MD immediately  if symptoms less severe.  You must read complete instructions/literature along with all the possible adverse reactions/side effects for all the Medicines you take and that have been prescribed to you. Take any new Medicines after you have completely understood and accpet all the possible adverse reactions/side effects.   Do not drive when taking Pain medications or sleeping medications (Benzodaizepines)  Do not take more than prescribed Pain, Sleep and Anxiety Medications. It is not advisable to combine anxiety,sleep and pain medications without talking with your primary care practitioner  Special Instructions: If you have smoked or chewed Tobacco  in the last 2 yrs please stop smoking, stop any regular Alcohol  and or any Recreational drug use.  Wear Seat belts while driving.  Please note: You were cared for by a hospitalist during your hospital stay. Once you are discharged, your primary care physician will handle any further medical issues. Please note that NO REFILLS for any discharge medications will be authorized once you are discharged, as it is imperative that you return to your primary care physician (or establish a relationship with a primary care physician if you do not have one) for your post hospital discharge needs so that  they can reassess your need for medications and monitor your lab values.   3 weeks of isolation from 12/11   Increase activity slowly   Complete by: As directed      Allergies as of 05/30/2019      Reactions   Mupirocin    PT STATES AFTER USING MUPIROCIN OINT IN HER NOSTRILS - SHE EXPERIENCED SEVERE HEADACHE RIGHT SIDE OF HEAD, SEVERE PAIN RIGHT EAR AND NUMBNESS RIGHT SIDE OF HEAD AND SEVERE DIZZINESS.   Other    PT STATES SHE CAN'T HAVE MRI - HAS METAL SCREWS IN HER SPINE FROM CERVICAL  FUSION.   Vicodin [hydrocodone-acetaminophen] Nausea Only      Medication List    TAKE these medications   aspirin  EC 81 MG tablet Take 81 mg by mouth daily.   buPROPion 300 MG 24 hr tablet Commonly known as: WELLBUTRIN XL Take 300 mg by mouth every morning.   carisoprodol 350 MG tablet Commonly known as: SOMA Take 350 mg by mouth at bedtime.   dexamethasone 6 MG tablet Commonly known as: DECADRON Take 1 tablet (6 mg total) by mouth daily for 4 days.   diazepam 5 MG tablet Commonly known as: VALIUM Take 5 mg by mouth every 8 (eight) hours as needed for muscle spasms.   DULoxetine 60 MG capsule Commonly known as: CYMBALTA Take 60 mg by mouth every morning.   estradiol 2 MG tablet Commonly known as: ESTRACE Take 1 mg by mouth daily. Taking 1/2 tablet daily   gabapentin 300 MG capsule Commonly known as: NEURONTIN Take 300 mg by mouth every 4 (four) hours as needed (pain in back).   levothyroxine 75 MCG tablet Commonly known as: SYNTHROID Take 75 mcg by mouth daily.   meclizine 25 MG tablet Commonly known as: ANTIVERT Take 25 mg by mouth every 6 (six) hours as needed for dizziness.   metoprolol succinate 50 MG 24 hr tablet Commonly known as: TOPROL-XL Take 50 mg by mouth at bedtime. Take with or immediately following a meal.   montelukast 10 MG tablet Commonly known as: SINGULAIR Take 10 mg by mouth every morning.   nabumetone 500 MG tablet Commonly known as:  RELAFEN Take 500 mg by mouth 2 (two) times daily.   pantoprazole 40 MG tablet Commonly known as: PROTONIX Take 40 mg by mouth at bedtime.   ramipril 5 MG capsule Commonly known as: ALTACE Take 5 mg by mouth at bedtime.   rizatriptan 10 MG disintegrating tablet Commonly known as: MAXALT-MLT Take 10 mg by mouth every 2 (two) hours as needed for migraine. May repeat in 2 hours if needed   Vitamin D-3 125 MCG (5000 UT) Tabs Take 5,000 Units by mouth daily.      Follow-up Information    Moon, Amy A, NP. Schedule an appointment as soon as possible for a visit in 1 week(s).   Specialty: Internal Medicine Contact information: [address] [place] [address] [phone]          Allergies  Allergen Reactions  . Mupirocin     PT STATES AFTER USING MUPIROCIN OINT IN HER NOSTRILS - SHE EXPERIENCED SEVERE HEADACHE RIGHT SIDE OF HEAD, SEVERE PAIN RIGHT EAR AND NUMBNESS RIGHT SIDE OF HEAD AND SEVERE DIZZINESS.  . Other     PT STATES SHE CAN'T HAVE MRI - HAS METAL SCREWS IN HER SPINE FROM CERVICAL  FUSION.  . Vicodin [Hydrocodone-Acetaminophen] Nausea Only    Consultations:   None  Other Procedures/Studies: DG Chest Portable 1 View  Result Date: 05/25/2019 CLINICAL DATA:  58 year old female with chest pain. Positive COVID-19. EXAM: PORTABLE CHEST 1 VIEW COMPARISON:  Chest radiograph dated 12/30/2013 and CT dated 05/07/2008. FINDINGS: Bilateral peripheral and subpleural densities most consistent with multifocal pneumonia, possibly atypical or viral in etiology. Clinical correlation is recommended. No pleural effusion or pneumothorax. Stable cardiac silhouette. There is scoliosis. Partially visualized lower cervical ACDF. No acute osseous pathology. IMPRESSION: Multifocal pneumonia, possibly atypical or viral in etiology. Clinical correlation is recommended. Electronically Signed   By: Elgie CollardArash  Radparvar M.D.   On: 05/25/2019 22:21     TODAY-DAY OF  DISCHARGE:  Subjective:   Marcelle Smilingaula Hubbert today has no headache,no chest abdominal pain,no new weakness tingling or numbness,  feels much better wants to go home today.   Objective:   Blood pressure (!) 159/127, pulse 86, temperature 98.2 F (36.8 C), temperature source Oral, resp. rate 18, height  (1.651 m), weight 80.7 kg, SpO2 95 %.  Intake/Output Summary (Last 24 hours) at 05/30/2019 1115 Last data filed at 05/29/2019 1600 Gross per 24 hour  Intake 840 ml  Output --  Net 840 ml   Filed Weights   05/25/19 2239  Weight: 80.7 kg    Exam: Awake Alert, Oriented *3, No new F.N deficits, Normal affect [place].AT,PERRAL Supple Neck,No JVD, No cervical lymphadenopathy appriciated.  Symmetrical Chest wall movement, Good air movement bilaterally, CTAB RRR,No Gallops,Rubs or new Murmurs, No Parasternal Heave +ve B.Sounds, Abd Soft, Non tender, No organomegaly appriciated, No rebound -guarding or rigidity. No Cyanosis, Clubbing or edema, No new Rash or bruise   PERTINENT RADIOLOGIC STUDIES: DG Chest Portable 1 View  Result Date: 05/25/2019 CLINICAL DATA:  58 year old female with chest pain. Positive COVID-19. EXAM: PORTABLE CHEST 1 VIEW COMPARISON:  Chest radiograph dated 12/30/2013 and CT dated 05/07/2008. FINDINGS: Bilateral peripheral and subpleural densities most consistent with multifocal pneumonia, possibly atypical or viral in etiology. Clinical correlation is recommended. No pleural effusion or pneumothorax. Stable cardiac silhouette. There is scoliosis. Partially visualized lower cervical ACDF. No acute osseous pathology. IMPRESSION: Multifocal pneumonia, possibly atypical or viral in etiology. Clinical correlation is recommended. Electronically Signed   By: Elgie Collard M.D.   On: 05/25/2019 22:21     PERTINENT LAB RESULTS: CBC: Recent Labs    05/29/19 0540 05/30/19 0407  WBC 13.6* 16.0*  HGB 12.3 13.3  HCT 36.6 40.5  PLT 525* 648*   CMET CMP     Component  Value Date/Time   NA 132 (L) 05/30/2019 0407   K 4.7 05/30/2019 0407   CL 91 (L) 05/30/2019 0407   CO2 27 05/30/2019 0407   GLUCOSE 131 (H) 05/30/2019 0407   BUN 31 (H) 05/30/2019 0407   CREATININE 0.98 05/30/2019 0407   CALCIUM 9.1 05/30/2019 0407   PROT 7.4 05/30/2019 0407   ALBUMIN 3.2 (L) 05/30/2019 0407   AST 29 05/30/2019 0407   ALT 66 (H) 05/30/2019 0407   ALKPHOS 118 05/30/2019 0407   BILITOT 0.2 (L) 05/30/2019 0407   GFRNONAA >60 05/30/2019 0407   GFRAA >60 05/30/2019 0407    GFR Estimated Creatinine Clearance: 64.1 mL/min (by C-G formula based on SCr of 0.98 mg/dL). No results for input(s): LIPASE, AMYLASE in the last 72 hours. No results for input(s): CKTOTAL, CKMB, CKMBINDEX, TROPONINI in the last 72 hours. Invalid input(s): POCBNP Recent Labs    05/29/19 0540 05/30/19 0407  DDIMER 0.89* 0.77*   No results for input(s): HGBA1C in the last 72 hours. No results for input(s): CHOL, HDL, LDLCALC, TRIG, CHOLHDL, LDLDIRECT in the last 72 hours. No results for input(s): TSH, T4TOTAL, T3FREE, THYROIDAB in the last 72 hours.  Invalid input(s): FREET3 Recent Labs    05/29/19 0540 05/30/19 0407  FERRITIN 132 120   Coags: No results for input(s): INR in the last 72 hours.  Invalid input(s): PT Microbiology: No results found for this or any previous visit (from the past 240 hour(s)).  FURTHER DISCHARGE INSTRUCTIONS:  Get Medicines reviewed and adjusted: Please take all your medications with you for your next visit with your Primary MD  Laboratory/radiological data: Please request your Primary MD to go over all hospital tests and procedure/radiological results at the follow up, please ask your Primary MD  to get all Hospital records sent to his/her office.  In some cases, they will be blood work, cultures and biopsy results pending at the time of your discharge. Please request that your primary care M.D. goes through all the records of your hospital data and  follows up on these results.  Also Note the following: If you experience worsening of your admission symptoms, develop shortness of breath, life threatening emergency, suicidal or homicidal thoughts you must seek medical attention immediately by calling 911 or calling your MD immediately  if symptoms less severe.  You must read complete instructions/literature along with all the possible adverse reactions/side effects for all the Medicines you take and that have been prescribed to you. Take any new Medicines after you have completely understood and accpet all the possible adverse reactions/side effects.   Do not drive when taking Pain medications or sleeping medications (Benzodaizepines)  Do not take more than prescribed Pain, Sleep and Anxiety Medications. It is not advisable to combine anxiety,sleep and pain medications without talking with your primary care practitioner  Special Instructions: If you have smoked or chewed Tobacco  in the last 2 yrs please stop smoking, stop any regular Alcohol  and or any Recreational drug use.  Wear Seat belts while driving.  Please note: You were cared for by a hospitalist during your hospital stay. Once you are discharged, your primary care physician will handle any further medical issues. Please note that NO REFILLS for any discharge medications will be authorized once you are discharged, as it is imperative that you return to your primary care physician (or establish a relationship with a primary care physician if you do not have one) for your post hospital discharge needs so that they can reassess your need for medications and monitor your lab values.  Total Time spent coordinating discharge including counseling, education and face to face time equals 35 minutes.  SignedJeoffrey Massed 05/30/2019 11:15 AM

## 2019-05-30 NOTE — Progress Notes (Signed)
SATURATION QUALIFICATIONS: (This note is used to comply with regulatory documentation for home oxygen)  Patient Saturations on Room Air at Rest 95%  Patient Saturations on Room Air while Ambulating = 90-93%  Patient Saturations on 2 Liters of oxygen while Ambulating = 97%  Please briefly explain why patient needs home oxygen:  Minor SOB but overall patient tolerated ambulating well. No complications noted. Will notify physician of results.

## 2019-05-30 NOTE — Progress Notes (Signed)
Pt discharged to private vehicle via wheelchair. Patient entered private driven by her husband.

## 2019-05-30 NOTE — Plan of Care (Signed)

## 2019-06-09 DIAGNOSIS — I10 Essential (primary) hypertension: Secondary | ICD-10-CM | POA: Diagnosis not present

## 2019-06-09 DIAGNOSIS — E039 Hypothyroidism, unspecified: Secondary | ICD-10-CM | POA: Diagnosis not present

## 2019-06-09 DIAGNOSIS — E781 Pure hyperglyceridemia: Secondary | ICD-10-CM | POA: Diagnosis not present

## 2019-06-09 DIAGNOSIS — U071 COVID-19: Secondary | ICD-10-CM | POA: Diagnosis not present

## 2019-06-16 DIAGNOSIS — U071 COVID-19: Secondary | ICD-10-CM | POA: Diagnosis not present

## 2019-06-16 DIAGNOSIS — B9689 Other specified bacterial agents as the cause of diseases classified elsewhere: Secondary | ICD-10-CM | POA: Diagnosis not present

## 2019-06-16 DIAGNOSIS — E875 Hyperkalemia: Secondary | ICD-10-CM | POA: Diagnosis not present

## 2019-06-16 DIAGNOSIS — J019 Acute sinusitis, unspecified: Secondary | ICD-10-CM | POA: Diagnosis not present

## 2019-06-25 ENCOUNTER — Other Ambulatory Visit: Payer: Self-pay

## 2019-06-30 DIAGNOSIS — Z6827 Body mass index (BMI) 27.0-27.9, adult: Secondary | ICD-10-CM | POA: Diagnosis not present

## 2019-06-30 DIAGNOSIS — I10 Essential (primary) hypertension: Secondary | ICD-10-CM | POA: Diagnosis not present

## 2019-06-30 DIAGNOSIS — U071 COVID-19: Secondary | ICD-10-CM | POA: Diagnosis not present

## 2019-06-30 DIAGNOSIS — F988 Other specified behavioral and emotional disorders with onset usually occurring in childhood and adolescence: Secondary | ICD-10-CM | POA: Diagnosis not present

## 2019-07-07 DIAGNOSIS — M5136 Other intervertebral disc degeneration, lumbar region: Secondary | ICD-10-CM | POA: Diagnosis not present

## 2019-07-07 DIAGNOSIS — M5416 Radiculopathy, lumbar region: Secondary | ICD-10-CM | POA: Diagnosis not present

## 2019-07-07 DIAGNOSIS — Z79899 Other long term (current) drug therapy: Secondary | ICD-10-CM | POA: Diagnosis not present

## 2019-07-28 DIAGNOSIS — Z1152 Encounter for screening for COVID-19: Secondary | ICD-10-CM | POA: Diagnosis not present

## 2019-08-04 DIAGNOSIS — M5416 Radiculopathy, lumbar region: Secondary | ICD-10-CM | POA: Diagnosis not present

## 2019-08-25 DIAGNOSIS — M5416 Radiculopathy, lumbar region: Secondary | ICD-10-CM | POA: Diagnosis not present

## 2019-08-26 DIAGNOSIS — H903 Sensorineural hearing loss, bilateral: Secondary | ICD-10-CM | POA: Diagnosis not present

## 2019-09-02 DIAGNOSIS — Z23 Encounter for immunization: Secondary | ICD-10-CM | POA: Diagnosis not present

## 2019-09-23 DIAGNOSIS — H6983 Other specified disorders of Eustachian tube, bilateral: Secondary | ICD-10-CM | POA: Diagnosis not present

## 2019-09-23 DIAGNOSIS — H9313 Tinnitus, bilateral: Secondary | ICD-10-CM | POA: Diagnosis not present

## 2019-09-23 DIAGNOSIS — H903 Sensorineural hearing loss, bilateral: Secondary | ICD-10-CM | POA: Diagnosis not present

## 2019-09-29 DIAGNOSIS — I10 Essential (primary) hypertension: Secondary | ICD-10-CM | POA: Diagnosis not present

## 2019-09-29 DIAGNOSIS — Z6827 Body mass index (BMI) 27.0-27.9, adult: Secondary | ICD-10-CM | POA: Diagnosis not present

## 2019-09-29 DIAGNOSIS — E781 Pure hyperglyceridemia: Secondary | ICD-10-CM | POA: Diagnosis not present

## 2019-09-29 DIAGNOSIS — F988 Other specified behavioral and emotional disorders with onset usually occurring in childhood and adolescence: Secondary | ICD-10-CM | POA: Diagnosis not present

## 2019-09-30 DIAGNOSIS — Z23 Encounter for immunization: Secondary | ICD-10-CM | POA: Diagnosis not present

## 2019-11-18 DIAGNOSIS — Z1272 Encounter for screening for malignant neoplasm of vagina: Secondary | ICD-10-CM | POA: Diagnosis not present

## 2019-11-18 DIAGNOSIS — Z1231 Encounter for screening mammogram for malignant neoplasm of breast: Secondary | ICD-10-CM | POA: Diagnosis not present

## 2019-11-18 DIAGNOSIS — Z01419 Encounter for gynecological examination (general) (routine) without abnormal findings: Secondary | ICD-10-CM | POA: Diagnosis not present

## 2019-11-18 DIAGNOSIS — Z6827 Body mass index (BMI) 27.0-27.9, adult: Secondary | ICD-10-CM | POA: Diagnosis not present

## 2019-11-24 DIAGNOSIS — D0472 Carcinoma in situ of skin of left lower limb, including hip: Secondary | ICD-10-CM | POA: Diagnosis not present

## 2019-11-24 DIAGNOSIS — D1801 Hemangioma of skin and subcutaneous tissue: Secondary | ICD-10-CM | POA: Diagnosis not present

## 2019-11-24 DIAGNOSIS — L578 Other skin changes due to chronic exposure to nonionizing radiation: Secondary | ICD-10-CM | POA: Diagnosis not present

## 2019-11-24 DIAGNOSIS — L814 Other melanin hyperpigmentation: Secondary | ICD-10-CM | POA: Diagnosis not present

## 2019-11-24 DIAGNOSIS — D225 Melanocytic nevi of trunk: Secondary | ICD-10-CM | POA: Diagnosis not present

## 2019-12-23 DIAGNOSIS — M5416 Radiculopathy, lumbar region: Secondary | ICD-10-CM | POA: Diagnosis not present

## 2020-01-05 DIAGNOSIS — D0472 Carcinoma in situ of skin of left lower limb, including hip: Secondary | ICD-10-CM | POA: Diagnosis not present

## 2020-01-12 DIAGNOSIS — I10 Essential (primary) hypertension: Secondary | ICD-10-CM | POA: Diagnosis not present

## 2020-01-12 DIAGNOSIS — R5381 Other malaise: Secondary | ICD-10-CM | POA: Diagnosis not present

## 2020-01-12 DIAGNOSIS — E781 Pure hyperglyceridemia: Secondary | ICD-10-CM | POA: Diagnosis not present

## 2020-01-12 DIAGNOSIS — E785 Hyperlipidemia, unspecified: Secondary | ICD-10-CM | POA: Diagnosis not present

## 2020-01-12 DIAGNOSIS — R5383 Other fatigue: Secondary | ICD-10-CM | POA: Diagnosis not present

## 2020-04-06 DIAGNOSIS — M62830 Muscle spasm of back: Secondary | ICD-10-CM | POA: Diagnosis not present

## 2020-04-12 DIAGNOSIS — D1801 Hemangioma of skin and subcutaneous tissue: Secondary | ICD-10-CM | POA: Diagnosis not present

## 2020-04-12 DIAGNOSIS — D0472 Carcinoma in situ of skin of left lower limb, including hip: Secondary | ICD-10-CM | POA: Diagnosis not present

## 2020-04-12 DIAGNOSIS — L910 Hypertrophic scar: Secondary | ICD-10-CM | POA: Diagnosis not present

## 2020-04-19 DIAGNOSIS — R5383 Other fatigue: Secondary | ICD-10-CM | POA: Diagnosis not present

## 2020-04-19 DIAGNOSIS — R5381 Other malaise: Secondary | ICD-10-CM | POA: Diagnosis not present

## 2020-04-19 DIAGNOSIS — Z79899 Other long term (current) drug therapy: Secondary | ICD-10-CM | POA: Diagnosis not present

## 2020-04-19 DIAGNOSIS — Z23 Encounter for immunization: Secondary | ICD-10-CM | POA: Diagnosis not present

## 2020-04-19 DIAGNOSIS — E039 Hypothyroidism, unspecified: Secondary | ICD-10-CM | POA: Diagnosis not present

## 2020-04-19 DIAGNOSIS — E785 Hyperlipidemia, unspecified: Secondary | ICD-10-CM | POA: Diagnosis not present

## 2020-04-19 DIAGNOSIS — I10 Essential (primary) hypertension: Secondary | ICD-10-CM | POA: Diagnosis not present

## 2020-04-19 DIAGNOSIS — E538 Deficiency of other specified B group vitamins: Secondary | ICD-10-CM | POA: Diagnosis not present

## 2020-04-19 DIAGNOSIS — E559 Vitamin D deficiency, unspecified: Secondary | ICD-10-CM | POA: Diagnosis not present

## 2020-05-03 DIAGNOSIS — J019 Acute sinusitis, unspecified: Secondary | ICD-10-CM | POA: Diagnosis not present

## 2020-05-03 DIAGNOSIS — B9689 Other specified bacterial agents as the cause of diseases classified elsewhere: Secondary | ICD-10-CM | POA: Diagnosis not present

## 2020-05-03 DIAGNOSIS — Z20822 Contact with and (suspected) exposure to covid-19: Secondary | ICD-10-CM | POA: Diagnosis not present

## 2020-05-03 DIAGNOSIS — R6889 Other general symptoms and signs: Secondary | ICD-10-CM | POA: Diagnosis not present

## 2020-06-28 DIAGNOSIS — F330 Major depressive disorder, recurrent, mild: Secondary | ICD-10-CM | POA: Diagnosis not present

## 2020-06-28 DIAGNOSIS — F988 Other specified behavioral and emotional disorders with onset usually occurring in childhood and adolescence: Secondary | ICD-10-CM | POA: Diagnosis not present

## 2020-06-28 DIAGNOSIS — Z1211 Encounter for screening for malignant neoplasm of colon: Secondary | ICD-10-CM | POA: Diagnosis not present

## 2020-06-28 DIAGNOSIS — I10 Essential (primary) hypertension: Secondary | ICD-10-CM | POA: Diagnosis not present

## 2020-09-20 DIAGNOSIS — M62830 Muscle spasm of back: Secondary | ICD-10-CM | POA: Diagnosis not present

## 2020-10-06 DIAGNOSIS — I10 Essential (primary) hypertension: Secondary | ICD-10-CM | POA: Diagnosis not present

## 2020-10-06 DIAGNOSIS — M79605 Pain in left leg: Secondary | ICD-10-CM | POA: Diagnosis not present

## 2020-10-07 DIAGNOSIS — M79605 Pain in left leg: Secondary | ICD-10-CM | POA: Diagnosis not present

## 2020-10-07 DIAGNOSIS — M7989 Other specified soft tissue disorders: Secondary | ICD-10-CM | POA: Diagnosis not present

## 2020-10-13 DIAGNOSIS — I10 Essential (primary) hypertension: Secondary | ICD-10-CM | POA: Diagnosis not present

## 2020-10-13 DIAGNOSIS — R5383 Other fatigue: Secondary | ICD-10-CM | POA: Diagnosis not present

## 2020-10-13 DIAGNOSIS — E785 Hyperlipidemia, unspecified: Secondary | ICD-10-CM | POA: Diagnosis not present

## 2020-10-13 DIAGNOSIS — R5381 Other malaise: Secondary | ICD-10-CM | POA: Diagnosis not present

## 2020-11-14 DIAGNOSIS — M25562 Pain in left knee: Secondary | ICD-10-CM | POA: Diagnosis not present

## 2020-11-25 DIAGNOSIS — M25562 Pain in left knee: Secondary | ICD-10-CM

## 2020-11-25 DIAGNOSIS — S83272A Complex tear of lateral meniscus, current injury, left knee, initial encounter: Secondary | ICD-10-CM | POA: Diagnosis not present

## 2020-11-25 DIAGNOSIS — M1712 Unilateral primary osteoarthritis, left knee: Secondary | ICD-10-CM | POA: Diagnosis not present

## 2020-11-25 DIAGNOSIS — S83242A Other tear of medial meniscus, current injury, left knee, initial encounter: Secondary | ICD-10-CM | POA: Diagnosis not present

## 2020-11-25 HISTORY — DX: Pain in left knee: M25.562

## 2021-01-19 DIAGNOSIS — E785 Hyperlipidemia, unspecified: Secondary | ICD-10-CM | POA: Diagnosis not present

## 2021-01-19 DIAGNOSIS — R5381 Other malaise: Secondary | ICD-10-CM | POA: Diagnosis not present

## 2021-01-19 DIAGNOSIS — I10 Essential (primary) hypertension: Secondary | ICD-10-CM | POA: Diagnosis not present

## 2021-01-19 DIAGNOSIS — R5383 Other fatigue: Secondary | ICD-10-CM | POA: Diagnosis not present

## 2021-01-25 DIAGNOSIS — M1712 Unilateral primary osteoarthritis, left knee: Secondary | ICD-10-CM

## 2021-01-25 HISTORY — DX: Unilateral primary osteoarthritis, left knee: M17.12

## 2021-03-16 DIAGNOSIS — M25512 Pain in left shoulder: Secondary | ICD-10-CM | POA: Diagnosis not present

## 2021-04-20 DIAGNOSIS — M5416 Radiculopathy, lumbar region: Secondary | ICD-10-CM | POA: Diagnosis not present

## 2021-04-21 DIAGNOSIS — R5383 Other fatigue: Secondary | ICD-10-CM | POA: Diagnosis not present

## 2021-04-21 DIAGNOSIS — I10 Essential (primary) hypertension: Secondary | ICD-10-CM | POA: Diagnosis not present

## 2021-04-21 DIAGNOSIS — R5381 Other malaise: Secondary | ICD-10-CM | POA: Diagnosis not present

## 2021-04-21 DIAGNOSIS — E785 Hyperlipidemia, unspecified: Secondary | ICD-10-CM | POA: Diagnosis not present

## 2021-04-21 DIAGNOSIS — E559 Vitamin D deficiency, unspecified: Secondary | ICD-10-CM | POA: Diagnosis not present

## 2021-04-21 DIAGNOSIS — E039 Hypothyroidism, unspecified: Secondary | ICD-10-CM | POA: Diagnosis not present

## 2021-04-25 DIAGNOSIS — Z23 Encounter for immunization: Secondary | ICD-10-CM | POA: Diagnosis not present

## 2021-05-05 DIAGNOSIS — I10 Essential (primary) hypertension: Secondary | ICD-10-CM | POA: Diagnosis not present

## 2021-05-05 DIAGNOSIS — R300 Dysuria: Secondary | ICD-10-CM | POA: Diagnosis not present

## 2021-05-10 ENCOUNTER — Other Ambulatory Visit (HOSPITAL_COMMUNITY): Payer: Self-pay | Admitting: Medical

## 2021-05-10 ENCOUNTER — Other Ambulatory Visit: Payer: Self-pay

## 2021-05-10 ENCOUNTER — Ambulatory Visit (HOSPITAL_COMMUNITY)
Admission: RE | Admit: 2021-05-10 | Discharge: 2021-05-10 | Disposition: A | Discharge status: Home / Self Care | Payer: BC Managed Care – PPO | Source: Ambulatory Visit | Attending: Cardiovascular Disease | Admitting: Cardiovascular Disease

## 2021-05-10 DIAGNOSIS — M79602 Pain in left arm: Secondary | ICD-10-CM | POA: Diagnosis not present

## 2021-05-10 DIAGNOSIS — Z1231 Encounter for screening mammogram for malignant neoplasm of breast: Secondary | ICD-10-CM | POA: Diagnosis not present

## 2021-05-10 DIAGNOSIS — M79601 Pain in right arm: Secondary | ICD-10-CM | POA: Diagnosis not present

## 2021-05-10 DIAGNOSIS — M25512 Pain in left shoulder: Secondary | ICD-10-CM | POA: Diagnosis not present

## 2021-05-10 DIAGNOSIS — Z01419 Encounter for gynecological examination (general) (routine) without abnormal findings: Secondary | ICD-10-CM | POA: Diagnosis not present

## 2021-05-12 DIAGNOSIS — M25512 Pain in left shoulder: Secondary | ICD-10-CM | POA: Diagnosis not present

## 2021-05-17 DIAGNOSIS — M75122 Complete rotator cuff tear or rupture of left shoulder, not specified as traumatic: Secondary | ICD-10-CM | POA: Diagnosis not present

## 2021-05-20 DIAGNOSIS — M75120 Complete rotator cuff tear or rupture of unspecified shoulder, not specified as traumatic: Secondary | ICD-10-CM

## 2021-05-20 HISTORY — DX: Complete rotator cuff tear or rupture of unspecified shoulder, not specified as traumatic: M75.120

## 2021-05-22 DIAGNOSIS — I10 Essential (primary) hypertension: Secondary | ICD-10-CM | POA: Diagnosis not present

## 2021-05-22 DIAGNOSIS — F419 Anxiety disorder, unspecified: Secondary | ICD-10-CM | POA: Diagnosis not present

## 2021-06-12 DIAGNOSIS — M1712 Unilateral primary osteoarthritis, left knee: Secondary | ICD-10-CM | POA: Diagnosis not present

## 2021-06-19 DIAGNOSIS — I10 Essential (primary) hypertension: Secondary | ICD-10-CM | POA: Diagnosis not present

## 2021-06-19 DIAGNOSIS — F419 Anxiety disorder, unspecified: Secondary | ICD-10-CM | POA: Diagnosis not present

## 2021-07-20 DIAGNOSIS — I10 Essential (primary) hypertension: Secondary | ICD-10-CM | POA: Diagnosis not present

## 2021-07-20 DIAGNOSIS — N289 Disorder of kidney and ureter, unspecified: Secondary | ICD-10-CM | POA: Diagnosis not present

## 2021-07-20 DIAGNOSIS — R079 Chest pain, unspecified: Secondary | ICD-10-CM | POA: Diagnosis not present

## 2021-07-20 DIAGNOSIS — Z6828 Body mass index (BMI) 28.0-28.9, adult: Secondary | ICD-10-CM | POA: Diagnosis not present

## 2021-07-25 DIAGNOSIS — N951 Menopausal and female climacteric states: Secondary | ICD-10-CM

## 2021-07-25 HISTORY — DX: Menopausal and female climacteric states: N95.1

## 2021-08-13 DIAGNOSIS — M5416 Radiculopathy, lumbar region: Secondary | ICD-10-CM | POA: Diagnosis not present

## 2021-08-13 DIAGNOSIS — G8929 Other chronic pain: Secondary | ICD-10-CM | POA: Diagnosis not present

## 2021-09-21 ENCOUNTER — Encounter: Payer: Self-pay | Admitting: Cardiology

## 2021-09-21 ENCOUNTER — Ambulatory Visit: Payer: BC Managed Care – PPO | Admitting: Cardiology

## 2021-09-21 VITALS — BP 146/90 | HR 76 | Ht 66.0 in | Wt 177.8 lb

## 2021-09-21 DIAGNOSIS — M419 Scoliosis, unspecified: Secondary | ICD-10-CM

## 2021-09-21 DIAGNOSIS — I10 Essential (primary) hypertension: Secondary | ICD-10-CM | POA: Diagnosis not present

## 2021-09-21 DIAGNOSIS — R0609 Other forms of dyspnea: Secondary | ICD-10-CM

## 2021-09-21 DIAGNOSIS — E785 Hyperlipidemia, unspecified: Secondary | ICD-10-CM

## 2021-09-21 DIAGNOSIS — I209 Angina pectoris, unspecified: Secondary | ICD-10-CM

## 2021-09-21 DIAGNOSIS — I259 Chronic ischemic heart disease, unspecified: Secondary | ICD-10-CM

## 2021-09-21 HISTORY — DX: Angina pectoris, unspecified: I20.9

## 2021-09-21 HISTORY — DX: Hyperlipidemia, unspecified: E78.5

## 2021-09-21 MED ORDER — ASPIRIN EC 81 MG PO TBEC: 81.0000 mg | DELAYED_RELEASE_TABLET | Freq: Every day | ORAL | 3 refills | Status: AC

## 2021-09-21 NOTE — Patient Instructions (Signed)
Medication Instructions:   ? ?START: Enteric Coated ASA 1 by mouth daily ? ?*If you need a refill on your cardiac medications before your next appointment, please call your pharmacy* ? ? ?Lab Work: ?None ordered ?If you have labs (blood work) drawn today and your tests are completely normal, you will receive your results only by: ?MyChart Message (if you have MyChart) OR ?A paper copy in the mail ?If you have any lab test that is abnormal or we need to change your treatment, we will call you to review the results. ? ? ?Testing/Procedures: ?Your physician has requested that you have a lexiscan myoview. For further information please visit [email or web address]. Please follow instruction sheet, as given. ? ?The test will take approximately 3 to 4 hours to complete; you may bring reading material.  If someone comes with you to your appointment, they will need to remain in the main lobby due to limited space in the testing area. **If you are pregnant or breastfeeding, please notify the nuclear lab prior to your appointment** ? ?How to prepare for your Myocardial Perfusion Test: ?Do not eat or drink 3 hours prior to your test, except you may have water. ?Do not consume products containing caffeine (regular or decaffeinated) 12 hours prior to your test. (ex: coffee, chocolate, sodas, tea). ?Do bring a list of your current medications with you.  If not listed below, you may take your medications as normal. ?Do wear comfortable clothes (no dresses or overalls) and walking shoes, tennis shoes preferred (No heels or open toe shoes are allowed). ?Do NOT wear cologne, perfume, aftershave, or lotions (deodorant is allowed). ?If these instructions are not followed, your test will have to be rescheduled. ? ?Your physician has requested that you have an echocardiogram. Echocardiography is a painless test that uses sound waves to create images of your heart. It provides your doctor with information about the size and shape of your  heart and how well your heart?s chambers and valves are working. This procedure takes approximately one hour. There are no restrictions for this procedure. ? ? ?Follow-Up: ?At [facility], you and your health needs are our priority.  As part of our continuing mission to provide you with exceptional heart care, we have created designated Provider Care Teams.  These Care Teams include your primary Cardiologist (physician) and Advanced Practice Providers (APPs -  Physician Assistants and Nurse Practitioners) who all work together to provide you with the care you need, when you need it. ? ?We recommend signing up for the patient portal called "MyChart".  Sign up information is provided on this After Visit Summary.  MyChart is used to connect with patients for Virtual Visits (Telemedicine).  Patients are able to view lab/test results, encounter notes, upcoming appointments, etc.  Non-urgent messages can be sent to your provider as well.   ?To learn more about what you can do with MyChart, go to [email or web address].   ? ?Your next appointment:   ?2 month(s) ? ?The format for your next appointment:   ?In Person ? ?Provider:   ?Jenne Campus, MD ? ? ?Other Instructions ?Cardiac Nuclear Scan ?A cardiac nuclear scan is a test that is done to check the flow of blood to your heart. It is done when you are resting and when you are exercising. The test looks for problems such as: ?Not enough blood reaching a portion of the heart. ?The heart muscle not working as it should. ?You may need this test if: ?You have heart disease. ?You  have had lab results that are not normal. ?You have had heart surgery or a balloon procedure to open up blocked arteries (angioplasty). ?You have chest pain. ?You have shortness of breath. ?In this test, a special dye (tracer) is put into your bloodstream. The tracer will travel to your heart. A camera will then take pictures of your heart to see how the tracer moves through your heart. This  test is usually done at a hospital and takes 2-4 hours. ?Tell a doctor about: ?Any allergies you have. ?All medicines you are taking, including vitamins, herbs, eye drops, creams, and over-the-counter medicines. ?Any problems you or family members have had with anesthetic medicines. ?Any blood disorders you have. ?Any surgeries you have had. ?Any medical conditions you have. ?Whether you are pregnant or may be pregnant. ?What are the risks? ?Generally, this is a safe test. However, problems may occur, such as: ?Serious chest pain and heart attack. This is only a risk if the stress portion of the test is done. ?Rapid heartbeat. ?A feeling of warmth in your chest. This feeling usually does not last long. ?Allergic reaction to the tracer. ?What happens before the test? ?Ask your doctor about changing or stopping your normal medicines. This is important. ?Follow instructions from your doctor about what you cannot eat or drink. ?Remove your jewelry on the day of the test. ?What happens during the test? ?An IV tube will be inserted into one of your veins. ?Your doctor will give you a small amount of tracer through the IV tube. ?You will wait for 20-40 minutes while the tracer moves through your bloodstream. ?Your heart will be monitored with an electrocardiogram (ECG). ?You will lie down on an exam table. ?Pictures of your heart will be taken for about 15-20 minutes. ?You may also have a stress test. For this test, one of these things may be done: ?You will be asked to exercise on a treadmill or a stationary bike. ?You will be given medicines that will make your heart work harder. This is done if you are unable to exercise. ?When blood flow to your heart has peaked, a tracer will again be given through the IV tube. ?After 20-40 minutes, you will get back on the exam table. More pictures will be taken of your heart. ?Depending on the tracer that is used, more pictures may need to be taken 3-4 hours later. ?Your IV tube  will be removed when the test is over. ?The test may vary among doctors and hospitals. ?What happens after the test? ?Ask your doctor: ?Whether you can return to your normal schedule, including diet, activities, and medicines. ?Whether you should drink more fluids. This will help to remove the tracer from your body. Drink enough fluid to keep your pee (urine) pale yellow. ?Ask your doctor, or the department that is doing the test: ?When will my results be ready? ?How will I get my results? ?Summary ?A cardiac nuclear scan is a test that is done to check the flow of blood to your heart. ?Tell your doctor whether you are pregnant or may be pregnant. ?Before the test, ask your doctor about changing or stopping your normal medicines. This is important. ?Ask your doctor whether you can return to your normal activities. You may be asked to drink more fluids. ?This information is not intended to replace advice given to you by your health care provider. Make sure you discuss any questions you have with your health care provider. ?Document Revised: 09/10/2018 Document Reviewed:  11/04/2017 ?Elsevier Patient Education ? [place]. ? ? ? ?Echocardiogram ?An echocardiogram is a test that uses sound waves (ultrasound) to produce images of the heart. ?Images from an echocardiogram can provide important information about: ?Heart size and shape. ?The size and thickness and movement of your heart's walls. ?Heart muscle function and strength. ?Heart valve function or if you have stenosis. Stenosis is when the heart valves are too narrow. ?If blood is flowing backward through the heart valves (regurgitation). ?A tumor or infectious growth around the heart valves. ?Areas of heart muscle that are not working well because of poor blood flow or injury from a heart attack. ?Aneurysm detection. An aneurysm is a weak or damaged part of an artery wall. The wall bulges out from the normal force of blood pumping through the body. ?Tell a  health care provider about: ?Any allergies you have. ?All medicines you are taking, including vitamins, herbs, eye drops, creams, and over-the-counter medicines. ?Any blood disorders you have. ?Any surgeries

## 2021-09-21 NOTE — Progress Notes (Signed)
? ?Cardiology Consultation:   ? ?Date:  09/21/2021  ? ?ID:  [name], DOB 01/27/1961, MRN [id number] ? ?PCP:  Lowella Dandy, NP  ?Cardiologist:  Jenne Campus, MD  ? ?Referring MD: Lowella Dandy, NP  ? ?Chief Complaint  ?Patient presents with  ? Hypertension  ? ? ?History of Present Illness:   ? ?Stephanie Cobb is a 61 y.o. female who is being seen today for the evaluation of essential hypertension, chest pain at the request of Moon, Amy A, NP.  Past medical history significant for essential hypertension that she has been struggling for years, fibromyalgia, she is a neck smoker.  She was referred to [place] because there was difficulty controlling her blood pressure.  She is taking 2 medications right now amlodipine as well as alternates also as needed clonidine.  However story is much more interesting she tells me when she carry laundry basket going up stairs she will develop tightness in the chest she has to slow down and stop tightness gets better it also happened with emotional situations.  She thinks this is anxiety.  She graded sensation 3-4 and scale up to 10.  She does have multiple orthopedic limitations which include scoliosis.  She required multiple surgical intervention or different joints.  She admits that she does not exercise on the regular basis.  She used to smoke but quit years ago.  She is not on any special diet.  She does have dyslipidemia with LDL of 139 however HDL is very high of 96. ? ?Past Medical History:  ?Diagnosis Date  ? ADD (attention deficit disorder)   ? Anemia   ? Anxiety   ? Arthritis   ? OA / PAIN BOTH HIPS  ? Depression   ? Expected blood loss anemia 02/24/2014  ? Fibromyalgia   ? GERD (gastroesophageal reflux disease)   ? Headache(784.0)   ? MIGRAINES  ? Heart palpitations   ? NUCLEAR STRESS TEST 12/14/13 AT [place], EF 74%  ? Hypercholesterolemia 03/18/2018  ? Hypertension   ? Hypertensive disorder 03/18/2018  ? Hypertriglyceridemia   ? Hypothyroidism   ?  Insomnia   ? Kidney insufficiency   ? PT STATES BUN SLIGHTLY ELEVATED - HER DOCTORS ARE WATCHING  ? Metatarsalgia of right foot 11/21/2015  ? Migraine 03/18/2018  ? MRSA (methicillin resistant Staphylococcus aureus)   ? hx of 2012- left upper leg - treated with Vancomycin   ? Overweight (BMI 25.0-29.9) 01/13/2014  ? Pain   ? CHRONIC NECK PAIN - DDD CERVICOTHORACIC; HX OF CERVICA FUSION - PT STATES 2 AREAS HAVE NOT FUSED AND SHE WILL NEED FURTHER SURGERY IN THE FUTURE  ? Pain in joint of left knee 11/25/2020  ? Pneumonia   ? ABOUT 6 YRS AGO  ? PONV (postoperative nausea and vomiting)   ? 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 Scoliosis   ? ? ?Past Surgical History:  ?Procedure Laterality Date  ? ABDOMINAL HYSTERECTOMY    ? CERVICAL FUSION  2011  ? TOTAL HIP ARTHROPLASTY Left 01/12/2014  ? Procedure: LEFT TOTAL HIP ARTHROPLASTY ANTERIOR APPROACH;  Surgeon: Mauri Pole, MD;  Location: [facility];  Service: Orthopedics;  Laterality: Left;  ? TOTAL HIP ARTHROPLASTY Right 02/23/2014  ? Procedure: RIGHT TOTAL HIP ARTHROPLASTY ANTERIOR APPROACH;  Surgeon: Mauri Pole, MD;  Location: [facility];  Service: Orthopedics;  Laterality: Right;  ? TUBAL LIGATION    ? ? ?Current Medications: ?Current Meds  ?Medication Sig  ?  amLODipine (NORVASC) 5 MG tablet Take 10 mg by mouth in the morning and at bedtime.  ? buPROPion (WELLBUTRIN XL) 300 MG 24 hr tablet Take 300 mg by mouth every morning.  ? carisoprodol (SOMA) 350 MG tablet Take 350 mg by mouth at bedtime.   ? cloNIDine (CATAPRES) 0.1 MG tablet Take 0.1 mg by mouth as needed (BP 170or >).  ? cyclobenzaprine (FLEXERIL) 5 MG tablet Take 5 mg by mouth 3 (three) times daily as needed for muscle spasms.  ? diazepam (VALIUM) 5 MG tablet Take 5 mg by mouth every 8 (eight) hours as needed for muscle spasms (headaches).  ? DULoxetine (CYMBALTA) 60 MG capsule Take 60 mg by mouth every morning.  ? estradiol (ESTRACE) 2 MG tablet Take 1 mg by mouth daily. Taking 1/2 tablet daily  ? fluticasone (FLONASE) 50  MCG/ACT nasal spray Place 2 sprays into both nostrils daily.  ? gabapentin (NEURONTIN) 300 MG capsule Take 300 mg by mouth every 4 (four) hours as needed (pain in back).   ? levothyroxine (SYNTHROID) 75 MCG tablet Take 75 mcg by mouth daily.  ? Magnesium 250 MG TABS Take 2 tablets by mouth daily.  ? montelukast (SINGULAIR) 10 MG tablet Take 10 mg by mouth every morning.   ? nabumetone (RELAFEN) 500 MG tablet Take 500 mg by mouth 2 (two) times daily.  ? OVER THE COUNTER MEDICATION Take 1 drop by mouth as needed (back pain).  ? pantoprazole (PROTONIX) 40 MG tablet Take 40 mg by mouth at bedtime.   ? ramipril (ALTACE) 5 MG capsule Take 5-10 mg by mouth 2 (two) times daily.  ? Red Yeast Rice Extract (RED YEAST RICE PO) Take 1 tablet by mouth daily.  ? rizatriptan (MAXALT-MLT) 10 MG disintegrating tablet Take 10 mg by mouth every 2 (two) hours as needed for migraine. May repeat in 2 hours if needed  ?  ? ?Allergies:   Mupirocin, Other, Oxycodone, and Vicodin [hydrocodone-acetaminophen]  ? ?Social History  ? ?Socioeconomic History  ? Marital status: Married  ?  Spouse name: Not on file  ? Number of children: Not on file  ? Years of education: Not on file  ? Highest education level: Not on file  ?Occupational History  ? Not on file  ?Tobacco Use  ? Smoking status: Former  ?  Packs/day: 1.00  ?  Years: 10.00  ?  Pack years: 10.00  ?  Types: Cigarettes  ? Smokeless tobacco: Never  ?Substance and Sexual Activity  ? Alcohol use: Not Currently  ?  Comment: Socially  ? Drug use: Never  ? Sexual activity: Yes  ?Other Topics Concern  ? Not on file  ?Social History Narrative  ? Not on file  ? ?Social Determinants of Health  ? ?Financial Resource Strain: Not on file  ?Food Insecurity: Not on file  ?Transportation Needs: Not on file  ?Physical Activity: Not on file  ?Stress: Not on file  ?Social Connections: Not on file  ?  ? ?Family History: ?The patient's family history is not on file. ?ROS:   ?Please see the history of present  illness.    ?All 14 point review of systems negative except as described per history of present illness. ? ?EKGs/Labs/Other Studies Reviewed:   ? ?The following studies were reviewed today: ? ? ?EKG:  EKG is  ordered today.  The ekg ordered today demonstrates normal sinus rhythm, normal P interval, left axis deviation. ? ?Recent Labs: ?No results found for requested labs within  last 8760 hours.  ?Recent Lipid Panel ?No results found for: CHOL, TRIG, HDL, CHOLHDL, VLDL, LDLCALC, LDLDIRECT ? ?Physical Exam:   ? ?VS:  BP (!) 146/90 (BP Location: Left Arm, Patient Position: Sitting)   Pulse 76   Ht 5\' 6"  (1.676 m)   Wt 177 lb 12.8 oz (80.6 kg)   SpO2 94%   BMI 28.70 kg/m?    ? ?Wt Readings from Last 3 Encounters:  ?09/21/21 177 lb 12.8 oz (80.6 kg)  ?05/25/19 178 lb (80.7 kg)  ?02/23/14 168 lb 12.9 oz (76.6 kg)  ?  ? ?GEN:  Well nourished, well developed in no acute distress ?HEENT: Normal ?NECK: No JVD; No carotid bruits ?LYMPHATICS: No lymphadenopathy ?CARDIAC: RRR, no murmurs, no rubs, no gallops ?RESPIRATORY:  Clear to auscultation without rales, wheezing or rhonchi  ?ABDOMEN: Soft, non-tender, non-distended ?MUSCULOSKELETAL:  No edema; No deformity  ?SKIN: Warm and dry ?NEUROLOGIC:  Alert and oriented x 3 ?PSYCHIATRIC:  Normal affect  ? ?ASSESSMENT:   ? ?1. Primary hypertension   ?2. Angina pectoris ([place])   ?3. Scoliosis, unspecified scoliosis type, unspecified spinal region   ?4. Dyslipidemia   ? ?PLAN:   ? ?In order of problems listed above: ? ?Essential hypertension.  She is on 2 medications.  I will add metoprolol 25 twice daily but not because of high blood pressure but because of symptoms of angina pectoris.  I also asked her to start taking 1 baby aspirin every single day for risk reduction.  We did discuss different options about looking at her coronary arteries coronary CT angio could be a reasonable approach however she does have some baseline kidney dysfunction with creatinine of 1.19 with GFR being  low therefore we favor stress test.  If stress test is negative I think we will do calcium score try to determine her prognosis and also decide about potential  ?Scoliosis.  Noted.  Follow-up by orthopedics

## 2021-09-26 ENCOUNTER — Telehealth: Payer: Self-pay | Admitting: *Deleted

## 2021-09-26 DIAGNOSIS — F330 Major depressive disorder, recurrent, mild: Secondary | ICD-10-CM | POA: Diagnosis not present

## 2021-09-26 DIAGNOSIS — I10 Essential (primary) hypertension: Secondary | ICD-10-CM | POA: Diagnosis not present

## 2021-09-26 DIAGNOSIS — E785 Hyperlipidemia, unspecified: Secondary | ICD-10-CM | POA: Diagnosis not present

## 2021-09-26 DIAGNOSIS — R079 Chest pain, unspecified: Secondary | ICD-10-CM | POA: Diagnosis not present

## 2021-09-26 NOTE — Telephone Encounter (Signed)
Patient given detailed instructions per Myocardial Perfusion Study Information Sheet for the test on 10/03/21 at 0800. Patient notified to arrive 15 minutes early and that it is imperative to arrive on time for appointment to keep from having the test rescheduled. ? If you need to cancel or reschedule your appointment, please call the office within 24 hours of your appointment. . Patient verbalized understanding.Wynelle Dreier, Adelene Idler ? ? ?

## 2021-10-03 ENCOUNTER — Ambulatory Visit (INDEPENDENT_AMBULATORY_CARE_PROVIDER_SITE_OTHER): Payer: BC Managed Care – PPO

## 2021-10-03 DIAGNOSIS — I209 Angina pectoris, unspecified: Secondary | ICD-10-CM | POA: Diagnosis not present

## 2021-10-03 DIAGNOSIS — R0609 Other forms of dyspnea: Secondary | ICD-10-CM | POA: Diagnosis not present

## 2021-10-03 DIAGNOSIS — I10 Essential (primary) hypertension: Secondary | ICD-10-CM | POA: Diagnosis not present

## 2021-10-03 DIAGNOSIS — I259 Chronic ischemic heart disease, unspecified: Secondary | ICD-10-CM

## 2021-10-03 DIAGNOSIS — I517 Cardiomegaly: Secondary | ICD-10-CM

## 2021-10-03 DIAGNOSIS — I340 Nonrheumatic mitral (valve) insufficiency: Secondary | ICD-10-CM | POA: Diagnosis not present

## 2021-10-03 LAB — MYOCARDIAL PERFUSION IMAGING
LV dias vol: 70 mL (ref 46–106)
LV sys vol: 22 mL
Nuc Stress EF: 68 %
Peak HR: 84 {beats}/min
Rest HR: 51 {beats}/min
Rest Nuclear Isotope Dose: 10.7 mCi
SDS: 0
SRS: 1
SSS: 1
Stress Nuclear Isotope Dose: 29.9 mCi
TID: 0.94

## 2021-10-03 LAB — ECHOCARDIOGRAM COMPLETE
Area-P 1/2: 3.79 cm2
Height: 66 in
S' Lateral: 3.1 cm
Weight: 2832 oz

## 2021-10-03 MED ORDER — TECHNETIUM TC 99M TETROFOSMIN IV KIT
29.9000 | PACK | Freq: Once | INTRAVENOUS | Status: AC | PRN
  Administered 2021-10-03: 29.9 via INTRAVENOUS

## 2021-10-03 MED ORDER — TECHNETIUM TC 99M TETROFOSMIN IV KIT
10.7000 | PACK | Freq: Once | INTRAVENOUS | Status: AC | PRN
  Administered 2021-10-03: 10.7 via INTRAVENOUS

## 2021-10-03 MED ORDER — REGADENOSON 0.4 MG/5ML IV SOLN
0.4000 mg | Freq: Once | INTRAVENOUS | Status: AC
  Administered 2021-10-03: 0.4 mg via INTRAVENOUS

## 2021-10-05 ENCOUNTER — Telehealth: Payer: Self-pay

## 2021-10-05 NOTE — Telephone Encounter (Signed)
Patient notified of results.

## 2021-10-05 NOTE — Telephone Encounter (Signed)
-----   Message from Georgeanna Lea, MD sent at 10/04/2021  9:35 PM EDT ----- ?Stress test was normal ?

## 2021-10-05 NOTE — Telephone Encounter (Signed)
-----   Message from Georgeanna Lea, MD sent at 10/04/2021  9:35 PM EDT ----- ?Echocardiogram showed preserved left ventricle ejection fraction, mild mitral valve regurgitation ?

## 2021-11-29 ENCOUNTER — Encounter: Payer: Self-pay | Admitting: Cardiology

## 2021-11-29 ENCOUNTER — Ambulatory Visit: Payer: BC Managed Care – PPO | Admitting: Cardiology

## 2021-11-29 VITALS — BP 128/78 | HR 88 | Ht 66.0 in | Wt 173.8 lb

## 2021-11-29 DIAGNOSIS — I10 Essential (primary) hypertension: Secondary | ICD-10-CM

## 2021-11-29 DIAGNOSIS — I209 Angina pectoris, unspecified: Secondary | ICD-10-CM

## 2021-11-29 DIAGNOSIS — E785 Hyperlipidemia, unspecified: Secondary | ICD-10-CM | POA: Diagnosis not present

## 2021-11-29 DIAGNOSIS — M797 Fibromyalgia: Secondary | ICD-10-CM | POA: Diagnosis not present

## 2021-11-29 NOTE — Progress Notes (Unsigned)
Cardiology Office Note:    Date:  11/29/2021   ID:  Stephanie Cobb 03/20/1961, MRN [id number]  PCP:  Hurshel Party, NP  Cardiologist:  Gypsy Balsam, MD    Referring MD: Hurshel Party, NP   Chief Complaint  Patient presents with   Follow-up    History of Present Illness:    Stephanie Cobb is a 61 y.o. female she was referred to [place] because of difficulty controlling her blood pressure however conversation with her revealed that she was having also some chest pain.  Somewhat more than characteristic because of some kidney dysfunction we elected to do stress test.  Stress test was perfectly normal she says she is doing better but chest pain still happening sometimes but less because she had difficulty walking because of orthopedic limitations.  Past Medical History:  Diagnosis Date   ADD (attention deficit disorder)    Anemia    Anxiety    Arthritis    OA / PAIN BOTH HIPS   Depression    Expected blood loss anemia 02/24/2014   Fibromyalgia    GERD (gastroesophageal reflux disease)    Headache(784.0)    MIGRAINES   Heart palpitations    NUCLEAR STRESS TEST 12/14/13 AT [facility]- NO ISCHEMIA, EF 74%   Hypercholesterolemia 03/18/2018   Hypertension    Hypertensive disorder 03/18/2018   Hypertriglyceridemia    Hypothyroidism    Insomnia    Kidney insufficiency    PT STATES BUN SLIGHTLY ELEVATED - HER DOCTORS ARE WATCHING   Metatarsalgia of right foot 11/21/2015   Migraine 03/18/2018   MRSA (methicillin resistant Staphylococcus aureus)    hx of 2012- left upper leg - treated with Vancomycin    Overweight (BMI 25.0-29.9) 01/13/2014   Pain    CHRONIC NECK PAIN - DDD CERVICOTHORACIC; HX OF CERVICA FUSION - PT STATES 2 AREAS HAVE NOT FUSED AND SHE WILL NEED FURTHER SURGERY IN THE FUTURE   Pain in joint of left knee 11/25/2020   Pneumonia    ABOUT 6 YRS AGO   PONV (postoperative nausea and vomiting)    S/P right THA, 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   Scoliosis     Past Surgical  History:  Procedure Laterality Date   ABDOMINAL HYSTERECTOMY     CERVICAL FUSION  2011   TOTAL HIP ARTHROPLASTY Left 01/12/2014   Procedure: LEFT TOTAL HIP ARTHROPLASTY ANTERIOR APPROACH;  Surgeon: Shelda Pal, MD;  Location: [facility];  Service: Orthopedics;  Laterality: Left;   TOTAL HIP ARTHROPLASTY Right 02/23/2014   Procedure: RIGHT TOTAL HIP ARTHROPLASTY ANTERIOR APPROACH;  Surgeon: Shelda Pal, MD;  Location: [facility];  Service: Orthopedics;  Laterality: Right;   TUBAL LIGATION      Current Medications: Current Meds  Medication Sig   amLODipine (NORVASC) 5 MG tablet Take 10 mg by mouth in the morning and at bedtime.   aspirin EC 81 MG tablet Take 1 tablet (81 mg total) by mouth daily. Swallow whole.   buPROPion (WELLBUTRIN XL) 300 MG 24 hr tablet Take 300 mg by mouth every morning.   carisoprodol (SOMA) 350 MG tablet Take 350 mg by mouth at bedtime.    cloNIDine (CATAPRES) 0.1 MG tablet Take 0.1 mg by mouth as needed (BP 170or >).   cyclobenzaprine (FLEXERIL) 5 MG tablet Take 5 mg by mouth at bedtime.   diazepam (VALIUM) 5 MG tablet Take 5 mg by mouth every 8 (eight) hours as needed for muscle spasms (headaches).   DULoxetine (CYMBALTA)  60 MG capsule Take 60 mg by mouth every morning.   estradiol (ESTRACE) 2 MG tablet Take 1 mg by mouth daily. Taking 1/2 tablet daily   fluticasone (FLONASE) 50 MCG/ACT nasal spray Place 2 sprays into both nostrils daily.   gabapentin (NEURONTIN) 300 MG capsule Take 300 mg by mouth every 4 (four) hours as needed (Nerve pain).   levothyroxine (SYNTHROID) 75 MCG tablet Take 75 mcg by mouth daily.   Magnesium 250 MG TABS Take 2 tablets by mouth daily.   montelukast (SINGULAIR) 10 MG tablet Take 10 mg by mouth every morning.    Multiple Vitamins-Minerals (CENTRUM WOMEN PO) Take 1 tablet by mouth in the morning and at bedtime.   nabumetone (RELAFEN) 500 MG tablet Take 500 mg by mouth 2 (two) times daily.   OVER THE COUNTER MEDICATION Take 1 drop by mouth  as needed (back pain). CBD oil 10 drops bid   pantoprazole (PROTONIX) 40 MG tablet Take 40 mg by mouth at bedtime.    ramipril (ALTACE) 5 MG capsule Take 5-10 mg by mouth 2 (two) times daily.   Red Yeast Rice Extract (RED YEAST RICE PO) Take 1 tablet by mouth daily.   rizatriptan (MAXALT-MLT) 10 MG disintegrating tablet Take 10 mg by mouth every 2 (two) hours as needed for migraine. May repeat in 2 hours if needed     Allergies:   Mupirocin, Other, Oxycodone, and Vicodin [hydrocodone-acetaminophen]   Social History   Socioeconomic History   Marital status: Married    Spouse name: Not on file   Number of children: Not on file   Years of education: Not on file   Highest education level: Not on file  Occupational History   Not on file  Tobacco Use   Smoking status: Former    Packs/day: 1.00    Years: 10.00    Total pack years: 10.00    Types: Cigarettes   Smokeless tobacco: Never  Substance and Sexual Activity   Alcohol use: Not Currently    Comment: Socially   Drug use: Never   Sexual activity: Yes  Other Topics Concern   Not on file  Social History Narrative   Not on file   Social Determinants of Health   Financial Resource Strain: Not on file  Food Insecurity: Not on file  Transportation Needs: Not on file  Physical Activity: Not on file  Stress: Not on file  Social Connections: Not on file     Family History: The patient's family history is not on file. ROS:   Please see the history of present illness.    All 14 point review of systems negative except as described per history of present illness  EKGs/Labs/Other Studies Reviewed:      Recent Labs: No results found for requested labs within last 365 days.  Recent Lipid Panel No results found for: "CHOL", "TRIG", "HDL", "CHOLHDL", "VLDL", "LDLCALC", "LDLDIRECT"  Physical Exam:    VS:  BP 128/78 (BP Location: Left Arm, Patient Position: Sitting)   Pulse 88   Ht 5\' 6"  (1.676 m)   Wt 173 lb 12.8 oz (78.8  kg)   SpO2 96%   BMI 28.05 kg/m     Wt Readings from Last 3 Encounters:  11/29/21 173 lb 12.8 oz (78.8 kg)  10/03/21 177 lb (80.3 kg)  09/21/21 177 lb 12.8 oz (80.6 kg)     GEN:  Well nourished, well developed in no acute distress HEENT: Normal NECK: No JVD; No carotid bruits LYMPHATICS:  No lymphadenopathy CARDIAC: RRR, no murmurs, no rubs, no gallops RESPIRATORY:  Clear to auscultation without rales, wheezing or rhonchi  ABDOMEN: Soft, non-tender, non-distended MUSCULOSKELETAL:  No edema; No deformity  SKIN: Warm and dry LOWER EXTREMITIES: no swelling NEUROLOGIC:  Alert and oriented x 3 PSYCHIATRIC:  Normal affect   ASSESSMENT:    1. Angina pectoris (HCC)   2. Primary hypertension   3. Dyslipidemia   4. Fibromyalgia    PLAN:    In order of problems listed above:  Angina pectoris 40 somewhat better.  She is on antiplatelet therapy which I will continue, she is on amlodipine.  Stress test negative but I am worried about her symptomatology I will ask her to have coronary calcium score based on that we will decide what will be neck step if it is normal 0 10 I think we can drop history of active coronary artery disease if he is very elevated then cardiac catheterization will be performed if his moderately elevated then we probably going to initiate statin therapy Essential hypertension blood pressure seems to well controlled right now. Dyslipidemia I did review K PN which show LDL of 102 HDL 76 we will wait for results of calcium score before making recommendations for therapy   Medication Adjustments/Labs and Tests Ordered: Current medicines are reviewed at length with the patient today.  Concerns regarding medicines are outlined above.  No orders of the defined types were placed in this encounter.  Medication changes: No orders of the defined types were placed in this encounter.   Signed, Georgeanna Lea, MD, [facility] 11/29/2021 11:28 AM    [facility]

## 2021-11-29 NOTE — Patient Instructions (Signed)
Medication Instructions:  Your physician recommends that you continue on your current medications as directed. Please refer to the Current Medication list given to you today.  *If you need a refill on your cardiac medications before your next appointment, please call your pharmacy*   Lab Work: None If you have labs (blood work) drawn today and your tests are completely normal, you will receive your results only by: MyChart Message (if you have MyChart) OR A paper copy in the mail If you have any lab test that is abnormal or we need to change your treatment, we will call you to review the results.   Testing/Procedures: We will order CT coronary calcium score. It will cost $99.00 and is not covered by insurance.  Please call to schedule.    [facility]  [address]  [place], [place] [address] [phone]            Or [facility] [address] [place], [place] [address] [phone]    Follow-Up: At [facility], you and your health needs are our priority.  As part of our continuing mission to provide you with exceptional heart care, we have created designated Provider Care Teams.  These Care Teams include your primary Cardiologist (physician) and Advanced Practice Providers (APPs -  Physician Assistants and Nurse Practitioners) who all work together to provide you with the care you need, when you need it.  We recommend signing up for the patient portal called "MyChart".  Sign up information is provided on this After Visit Summary.  MyChart is used to connect with patients for Virtual Visits (Telemedicine).  Patients are able to view lab/test results, encounter notes, upcoming appointments, etc.  Non-urgent messages can be sent to your provider as well.   To learn more about what you can do with MyChart, go to [email or web address].    Your next appointment:   3 month(s)  The format for your next appointment:   In Person  Provider:   Gypsy Balsam, MD    Other Instructions None  Important Information About Sugar

## 2021-12-04 ENCOUNTER — Ambulatory Visit (HOSPITAL_BASED_OUTPATIENT_CLINIC_OR_DEPARTMENT_OTHER)
Admission: RE | Admit: 2021-12-04 | Discharge: 2021-12-04 | Disposition: A | Discharge status: Home / Self Care | Payer: BC Managed Care – PPO | Source: Ambulatory Visit | Attending: Cardiology | Admitting: Cardiology

## 2021-12-04 DIAGNOSIS — E785 Hyperlipidemia, unspecified: Secondary | ICD-10-CM

## 2021-12-04 DIAGNOSIS — I209 Angina pectoris, unspecified: Secondary | ICD-10-CM

## 2021-12-04 DIAGNOSIS — M797 Fibromyalgia: Secondary | ICD-10-CM

## 2021-12-04 DIAGNOSIS — I10 Essential (primary) hypertension: Secondary | ICD-10-CM

## 2021-12-07 ENCOUNTER — Telehealth: Payer: Self-pay

## 2021-12-07 NOTE — Telephone Encounter (Signed)
Results reviewed with pt as per Dr. Krasowski's note.  Pt verbalized understanding and had no additional questions. Routed to PCP  

## 2021-12-20 DIAGNOSIS — M5416 Radiculopathy, lumbar region: Secondary | ICD-10-CM | POA: Diagnosis not present

## 2021-12-26 DIAGNOSIS — E039 Hypothyroidism, unspecified: Secondary | ICD-10-CM | POA: Diagnosis not present

## 2021-12-26 DIAGNOSIS — E785 Hyperlipidemia, unspecified: Secondary | ICD-10-CM | POA: Diagnosis not present

## 2021-12-26 DIAGNOSIS — N289 Disorder of kidney and ureter, unspecified: Secondary | ICD-10-CM | POA: Diagnosis not present

## 2021-12-26 DIAGNOSIS — F330 Major depressive disorder, recurrent, mild: Secondary | ICD-10-CM | POA: Diagnosis not present

## 2021-12-26 DIAGNOSIS — I10 Essential (primary) hypertension: Secondary | ICD-10-CM | POA: Diagnosis not present

## 2022-02-13 ENCOUNTER — Telehealth: Payer: Self-pay | Admitting: Cardiology

## 2022-02-13 NOTE — Telephone Encounter (Signed)
Called patient and she reported that yesterday she felt as if a person was punching her in the chest and grinding their knuckles into her chest. At 3 pm she noticed that her legs up to her knees were swollen. She went home and propped up her feet and went to bed at 6 pm. She woke up today and is feeling good today. She currently has chest tightness and is SOB with minimal exertion and states that these symptoms come and go every so often. She also reports that sometimes the chest tightness is a chest pressure like an elephant is sitting on her chest. Based on the symptoms she has reported  I recommended that she go to the ER to be evaluated. Patient stated that she did not want to go to the ER. I explained that they could evaluate her faster and help determine what was causing the chest tightness/ pressure. She reiterated that she did not want to go to the ER. I explained that if her symptoms got worse that she should go to the ER. Patient was agreeable with this and had no further questions at this time.

## 2022-02-13 NOTE — Telephone Encounter (Signed)
Pt c/o of Chest Pain: STAT if CP now or developed within 24 hours  1. Are you having CP right now? No  2. Are you experiencing any other symptoms (ex. SOB, nausea, vomiting, sweating)? Swelling & SOB 09/11  3. How long have you been experiencing CP? Just one day of swelling from ankles to knees and chest pain that she said felt like someone "punched through her chest".   4. Is your CP continuous or coming and going? Coming and going.   5. Have you taken Nitroglycerin? No.   Pt states she feels fine today but may need to discuss this due to med change recently.

## 2022-02-16 DIAGNOSIS — Z713 Dietary counseling and surveillance: Secondary | ICD-10-CM | POA: Diagnosis not present

## 2022-02-16 DIAGNOSIS — I10 Essential (primary) hypertension: Secondary | ICD-10-CM | POA: Diagnosis not present

## 2022-02-16 DIAGNOSIS — E785 Hyperlipidemia, unspecified: Secondary | ICD-10-CM | POA: Diagnosis not present

## 2022-02-16 DIAGNOSIS — E663 Overweight: Secondary | ICD-10-CM | POA: Diagnosis not present

## 2022-02-26 DIAGNOSIS — M25542 Pain in joints of left hand: Secondary | ICD-10-CM

## 2022-02-26 DIAGNOSIS — M189 Osteoarthritis of first carpometacarpal joint, unspecified: Secondary | ICD-10-CM

## 2022-02-26 DIAGNOSIS — M1812 Unilateral primary osteoarthritis of first carpometacarpal joint, left hand: Secondary | ICD-10-CM | POA: Diagnosis not present

## 2022-02-26 DIAGNOSIS — M7021 Olecranon bursitis, right elbow: Secondary | ICD-10-CM | POA: Diagnosis not present

## 2022-02-26 DIAGNOSIS — M25521 Pain in right elbow: Secondary | ICD-10-CM | POA: Diagnosis not present

## 2022-02-26 DIAGNOSIS — G5602 Carpal tunnel syndrome, left upper limb: Secondary | ICD-10-CM | POA: Diagnosis not present

## 2022-02-26 DIAGNOSIS — M7020 Olecranon bursitis, unspecified elbow: Secondary | ICD-10-CM | POA: Insufficient documentation

## 2022-02-26 HISTORY — DX: Osteoarthritis of first carpometacarpal joint, unspecified: M18.9

## 2022-02-26 HISTORY — DX: Pain in joints of left hand: M25.542

## 2022-02-26 HISTORY — DX: Olecranon bursitis, unspecified elbow: M70.20

## 2022-03-06 ENCOUNTER — Encounter: Payer: Self-pay | Admitting: Cardiology

## 2022-03-06 ENCOUNTER — Ambulatory Visit: Payer: BC Managed Care – PPO | Attending: Cardiology | Admitting: Cardiology

## 2022-03-06 VITALS — BP 130/82 | HR 80 | Ht 66.0 in | Wt 171.0 lb

## 2022-03-06 DIAGNOSIS — E785 Hyperlipidemia, unspecified: Secondary | ICD-10-CM

## 2022-03-06 DIAGNOSIS — M419 Scoliosis, unspecified: Secondary | ICD-10-CM | POA: Diagnosis not present

## 2022-03-06 DIAGNOSIS — M797 Fibromyalgia: Secondary | ICD-10-CM

## 2022-03-06 DIAGNOSIS — I209 Angina pectoris, unspecified: Secondary | ICD-10-CM

## 2022-03-06 DIAGNOSIS — R931 Abnormal findings on diagnostic imaging of heart and coronary circulation: Secondary | ICD-10-CM

## 2022-03-06 DIAGNOSIS — I10 Essential (primary) hypertension: Secondary | ICD-10-CM

## 2022-03-06 HISTORY — DX: Abnormal findings on diagnostic imaging of heart and coronary circulation: R93.1

## 2022-03-06 MED ORDER — PRAVASTATIN SODIUM 20 MG PO TABS: 20.0000 mg | ORAL_TABLET | Freq: Every evening | ORAL | 3 refills | Status: DC

## 2022-03-06 NOTE — Progress Notes (Signed)
Cardiology Office Note:    Date:  03/06/2022   ID:  Stephanie Cobb, Stephanie Cobb June 05, 1960, MRN [id number]  PCP:  Stephanie Dandy, NP  Cardiologist:  Stephanie Campus, MD    Referring MD: Stephanie Dandy, NP   Chief Complaint  Patient presents with   Follow-up  Doing better    History of Present Illness:    Stephanie Cobb is a 61 y.o. female with past medical history significant for chest pain, dyslipidemia, essential hypertension.  She was referred to [place] because of chest pain, stress test was done which was perfectly negative however she was still having some symptoms eventually end up doing a calcium score which was only 14 in the proximal LAD, which was 66 percentile.  She is coming today to my office for follow-up overall she is doing better.  Still described to have some chest pain but very rare.  She is trying to be active trying to walk on the regular basis but have a problem of the knee.  She is happy with the results of her calcium score which was relatively low  Past Medical History:  Diagnosis Date   ADD (attention deficit disorder)    Anemia    Anxiety    Arthritis    OA / PAIN BOTH HIPS   Depression    Expected blood loss anemia 02/24/2014   Fibromyalgia    GERD (gastroesophageal reflux disease)    Headache(784.0)    MIGRAINES   Heart palpitations    NUCLEAR STRESS TEST 12/14/13 AT [place], EF 74%   Hypercholesterolemia 03/18/2018   Hypertension    Hypertensive disorder 03/18/2018   Hypertriglyceridemia    Hypothyroidism    Insomnia    Kidney insufficiency    PT STATES BUN SLIGHTLY ELEVATED - HER DOCTORS ARE WATCHING   Metatarsalgia of right foot 11/21/2015   Migraine 03/18/2018   MRSA (methicillin resistant Staphylococcus aureus)    hx of 2012- left upper leg - treated with Vancomycin    Overweight (BMI 25.0-29.9) 01/13/2014   Pain    CHRONIC NECK PAIN - DDD CERVICOTHORACIC; HX OF CERVICA FUSION - PT STATES 2 AREAS HAVE NOT FUSED AND SHE WILL NEED  FURTHER SURGERY IN THE FUTURE   Pain in joint of left knee 11/25/2020   Pneumonia    ABOUT 6 YRS AGO   PONV (postoperative nausea and vomiting)    S/P right THA, 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   Scoliosis     Past Surgical History:  Procedure Laterality Date   ABDOMINAL HYSTERECTOMY     CERVICAL FUSION  2011   TOTAL HIP ARTHROPLASTY Left 01/12/2014   Procedure: LEFT TOTAL HIP ARTHROPLASTY ANTERIOR APPROACH;  Surgeon: Mauri Pole, MD;  Location: [facility];  Service: Orthopedics;  Laterality: Left;   TOTAL HIP ARTHROPLASTY Right 02/23/2014   Procedure: RIGHT TOTAL HIP ARTHROPLASTY ANTERIOR APPROACH;  Surgeon: Mauri Pole, MD;  Location: [facility];  Service: Orthopedics;  Laterality: Right;   TUBAL LIGATION      Current Medications: Current Meds  Medication Sig   amLODipine (NORVASC) 5 MG tablet Take 10 mg by mouth in the morning and at bedtime.   aspirin EC 81 MG tablet Take 1 tablet (81 mg total) by mouth daily. Swallow whole.   buPROPion (WELLBUTRIN XL) 300 MG 24 hr tablet Take 300 mg by mouth every morning.   carisoprodol (SOMA) 350 MG tablet Take 350 mg by mouth at bedtime.    cloNIDine (CATAPRES) 0.1 MG tablet  Take 0.1 mg by mouth as needed (BP 170or >).   cyclobenzaprine (FLEXERIL) 5 MG tablet Take 5 mg by mouth at bedtime.   diazepam (VALIUM) 5 MG tablet Take 5 mg by mouth every 8 (eight) hours as needed for muscle spasms (headaches).   DULoxetine (CYMBALTA) 60 MG capsule Take 60 mg by mouth every morning.   estradiol (ESTRACE) 2 MG tablet Take 1 mg by mouth daily. Taking 1/2 tablet daily   gabapentin (NEURONTIN) 300 MG capsule Take 300 mg by mouth every 4 (four) hours as needed (Nerve pain).   levothyroxine (SYNTHROID) 75 MCG tablet Take 75 mcg by mouth daily.   Magnesium 250 MG TABS Take 2 tablets by mouth daily.   montelukast (SINGULAIR) 10 MG tablet Take 10 mg by mouth every morning.    Multiple Vitamins-Minerals (CENTRUM WOMEN PO) Take 1 tablet by mouth in the morning and at bedtime.    nabumetone (RELAFEN) 500 MG tablet Take 500 mg by mouth 2 (two) times daily.   OVER THE COUNTER MEDICATION Take 1 drop by mouth as needed (back pain). CBD oil 10 drops bid   pantoprazole (PROTONIX) 40 MG tablet Take 40 mg by mouth at bedtime.    ramipril (ALTACE) 5 MG capsule Take 5-10 mg by mouth 2 (two) times daily.   Red Yeast Rice Extract (RED YEAST RICE PO) Take 1 tablet by mouth daily.   rizatriptan (MAXALT-MLT) 10 MG disintegrating tablet Take 10 mg by mouth every 2 (two) hours as needed for migraine. May repeat in 2 hours if needed     Allergies:   Mupirocin, Other, Oxycodone, and Vicodin [hydrocodone-acetaminophen]   Social History   Socioeconomic History   Marital status: Married    Spouse name: Not on file   Number of children: Not on file   Years of education: Not on file   Highest education level: Not on file  Occupational History   Not on file  Tobacco Use   Smoking status: Former    Packs/day: 1.00    Years: 10.00    Total pack years: 10.00    Types: Cigarettes   Smokeless tobacco: Never  Substance and Sexual Activity   Alcohol use: Not Currently    Comment: Socially   Drug use: Never   Sexual activity: Yes  Other Topics Concern   Not on file  Social History Narrative   Not on file   Social Determinants of Health   Financial Resource Strain: Not on file  Food Insecurity: Not on file  Transportation Needs: Not on file  Physical Activity: Not on file  Stress: Not on file  Social Connections: Not on file     Family History: The patient's family history is not on file. ROS:   Please see the history of present illness.    All 14 point review of systems negative except as described per history of present illness  EKGs/Labs/Other Studies Reviewed:      Recent Labs: No results found for requested labs within last 365 days.  Recent Lipid Panel No results found for: "CHOL", "TRIG", "HDL", "CHOLHDL", "VLDL", "LDLCALC", "LDLDIRECT"  Physical Exam:     VS:  BP 130/82 (BP Location: Left Arm, Patient Position: Sitting)   Pulse 80   Ht 5\' 6"  (1.676 m)   Wt 171 lb (77.6 kg)   SpO2 96%   BMI 27.60 kg/m     Wt Readings from Last 3 Encounters:  03/06/22 171 lb (77.6 kg)  11/29/21 173 lb 12.8 oz (  78.8 kg)  10/03/21 177 lb (80.3 kg)     GEN:  Well nourished, well developed in no acute distress HEENT: Normal NECK: No JVD; No carotid bruits LYMPHATICS: No lymphadenopathy CARDIAC: RRR, no murmurs, no rubs, no gallops RESPIRATORY:  Clear to auscultation without rales, wheezing or rhonchi  ABDOMEN: Soft, non-tender, non-distended MUSCULOSKELETAL:  No edema; No deformity  SKIN: Warm and dry LOWER EXTREMITIES: no swelling NEUROLOGIC:  Alert and oriented x 3 PSYCHIATRIC:  Normal affect   ASSESSMENT:    1. Angina pectoris (HCC)   2. Elevated coronary artery calcium score   3. Scoliosis, unspecified scoliosis type, unspecified spinal region   4. Fibromyalgia   5. Primary hypertension    PLAN:    In order of problems listed above:  Angina pectoris controlled.  Stress test perfect negative.  Calcium score very low.  We will continue conservative approach.  She is on antiplatelet therapy as well as on calcium channel blocker which I continue. Dyslipidemia I did review her K PN which only her LDL 120 HDL 81.  I asked her to start taking pravastatin 20.  She was somewhat reluctant because she heard somewhat a bad thing about this medication but agreed to try. Scoliosis.  Noted. Essential hypertension: Blood pressure seems to well controlled we will continue present management.   Medication Adjustments/Labs and Tests Ordered: Current medicines are reviewed at length with the patient today.  Concerns regarding medicines are outlined above.  No orders of the defined types were placed in this encounter.  Medication changes: No orders of the defined types were placed in this encounter.   Signed, Park Liter, MD, [facility] 03/06/2022  10:26 AM    [place]

## 2022-03-06 NOTE — Patient Instructions (Signed)
Medication Instructions:  Your physician has recommended you make the following change in your medication:   START: Pravastatin 20mg  1 tablet daily     Lab Work: Your physician recommends that you return for lab work in: 6 weeks You need to have labs done when you are fasting.  You can come Monday through Friday 8:30 am to 12:00 pm and 1:15 to 4:30. You do not need to make an appointment as the order has already been placed. The labs you are going to have done are AST, ALT and Lipids.    Testing/Procedures: None Ordered   Follow-Up: At [facility], you and your health needs are our priority.  As part of our continuing mission to provide you with exceptional heart care, we have created designated Provider Care Teams.  These Care Teams include your primary Cardiologist (physician) and Advanced Practice Providers (APPs -  Physician Assistants and Nurse Practitioners) who all work together to provide you with the care you need, when you need it.  We recommend signing up for the patient portal called "MyChart".  Sign up information is provided on this After Visit Summary.  MyChart is used to connect with patients for Virtual Visits (Telemedicine).  Patients are able to view lab/test results, encounter notes, upcoming appointments, etc.  Non-urgent messages can be sent to your provider as well.   To learn more about what you can do with MyChart, go to [email or web address].    Your next appointment:   6 month(s)  The format for your next appointment:   In Person  Provider:   Jenne Campus, MD    Other Instructions NA

## 2022-03-06 NOTE — Addendum Note (Signed)
Addended by: Jacobo Forest D on: 03/06/2022 10:37 AM   Modules accepted: Orders

## 2022-03-20 DIAGNOSIS — U071 COVID-19: Secondary | ICD-10-CM | POA: Diagnosis not present

## 2022-03-20 DIAGNOSIS — J069 Acute upper respiratory infection, unspecified: Secondary | ICD-10-CM | POA: Diagnosis not present

## 2022-04-12 DIAGNOSIS — M1712 Unilateral primary osteoarthritis, left knee: Secondary | ICD-10-CM | POA: Diagnosis not present

## 2022-04-16 DIAGNOSIS — M7021 Olecranon bursitis, right elbow: Secondary | ICD-10-CM | POA: Diagnosis not present

## 2022-04-16 DIAGNOSIS — M1812 Unilateral primary osteoarthritis of first carpometacarpal joint, left hand: Secondary | ICD-10-CM | POA: Diagnosis not present

## 2022-04-19 DIAGNOSIS — M1712 Unilateral primary osteoarthritis, left knee: Secondary | ICD-10-CM | POA: Diagnosis not present

## 2022-04-19 DIAGNOSIS — M25562 Pain in left knee: Secondary | ICD-10-CM | POA: Diagnosis not present

## 2022-04-20 DIAGNOSIS — F330 Major depressive disorder, recurrent, mild: Secondary | ICD-10-CM | POA: Diagnosis not present

## 2022-04-20 DIAGNOSIS — N289 Disorder of kidney and ureter, unspecified: Secondary | ICD-10-CM | POA: Diagnosis not present

## 2022-04-20 DIAGNOSIS — E039 Hypothyroidism, unspecified: Secondary | ICD-10-CM | POA: Diagnosis not present

## 2022-04-20 DIAGNOSIS — I10 Essential (primary) hypertension: Secondary | ICD-10-CM | POA: Diagnosis not present

## 2022-04-20 DIAGNOSIS — E785 Hyperlipidemia, unspecified: Secondary | ICD-10-CM | POA: Diagnosis not present

## 2022-04-20 DIAGNOSIS — Z23 Encounter for immunization: Secondary | ICD-10-CM | POA: Diagnosis not present

## 2022-04-23 DIAGNOSIS — G8929 Other chronic pain: Secondary | ICD-10-CM | POA: Diagnosis not present

## 2022-04-23 DIAGNOSIS — Z5181 Encounter for therapeutic drug level monitoring: Secondary | ICD-10-CM | POA: Diagnosis not present

## 2022-04-23 DIAGNOSIS — M5416 Radiculopathy, lumbar region: Secondary | ICD-10-CM | POA: Diagnosis not present

## 2022-04-23 DIAGNOSIS — M62830 Muscle spasm of back: Secondary | ICD-10-CM | POA: Diagnosis not present

## 2022-04-25 ENCOUNTER — Other Ambulatory Visit: Payer: Self-pay

## 2022-04-25 ENCOUNTER — Telehealth: Payer: Self-pay | Admitting: Cardiology

## 2022-04-25 DIAGNOSIS — M1712 Unilateral primary osteoarthritis, left knee: Secondary | ICD-10-CM | POA: Diagnosis not present

## 2022-04-25 NOTE — Telephone Encounter (Signed)
Called patient and she reported that she has been feeling very tired even more so than normal.She is unable to sleep and she is having headaches and her balance is off. Patient also reports that she has a history of fibromyalgia. She states that Dr. Bing Matter started her on Pravastatin and she recently had labs done and her creatinine and potasium were elevated. I spoke to Dr. Bing Matter regarding her symptoms and he recommended that she follow up with her PCP. I informed the patient of Dr. Vanetta Shawl recommendation and she had no further questions at this time.

## 2022-04-25 NOTE — Telephone Encounter (Signed)
Pt states that she is wanting to bring lab work in that she took with her PCP. She states that she has some concerns about how she is feeling recently and would like to discuss this.

## 2022-05-14 DIAGNOSIS — N289 Disorder of kidney and ureter, unspecified: Secondary | ICD-10-CM | POA: Diagnosis not present

## 2022-05-20 DIAGNOSIS — Z1212 Encounter for screening for malignant neoplasm of rectum: Secondary | ICD-10-CM | POA: Diagnosis not present

## 2022-05-20 DIAGNOSIS — Z1211 Encounter for screening for malignant neoplasm of colon: Secondary | ICD-10-CM | POA: Diagnosis not present

## 2022-05-26 LAB — COLOGUARD: COLOGUARD: NEGATIVE

## 2022-07-05 ENCOUNTER — Ambulatory Visit: Payer: BC Managed Care – PPO | Attending: Cardiology | Admitting: Cardiology

## 2022-07-05 ENCOUNTER — Encounter: Payer: Self-pay | Admitting: Cardiology

## 2022-07-05 VITALS — BP 150/82 | HR 82 | Ht 66.0 in | Wt 180.2 lb

## 2022-07-05 DIAGNOSIS — E785 Hyperlipidemia, unspecified: Secondary | ICD-10-CM

## 2022-07-05 DIAGNOSIS — I209 Angina pectoris, unspecified: Secondary | ICD-10-CM | POA: Diagnosis not present

## 2022-07-05 DIAGNOSIS — I10 Essential (primary) hypertension: Secondary | ICD-10-CM

## 2022-07-05 DIAGNOSIS — R931 Abnormal findings on diagnostic imaging of heart and coronary circulation: Secondary | ICD-10-CM

## 2022-07-05 NOTE — Patient Instructions (Signed)
Medication Instructions:  Your physician recommends that you continue on your current medications as directed. Please refer to the Current Medication list given to you today.  *If you need a refill on your cardiac medications before your next appointment, please call your pharmacy*   Lab Work: BMP today If you have labs (blood work) drawn today and your tests are completely normal, you will receive your results only by: MyChart Message (if you have MyChart) OR A paper copy in the mail If you have any lab test that is abnormal or we need to change your treatment, we will call you to review the results.   Testing/Procedures: None Ordered   Follow-Up: At [facility], you and your health needs are our priority.  As part of our continuing mission to provide you with exceptional heart care, we have created designated Provider Care Teams.  These Care Teams include your primary Cardiologist (physician) and Advanced Practice Providers (APPs -  Physician Assistants and Nurse Practitioners) who all work together to provide you with the care you need, when you need it.  We recommend signing up for the patient portal called "MyChart".  Sign up information is provided on this After Visit Summary.  MyChart is used to connect with patients for Virtual Visits (Telemedicine).  Patients are able to view lab/test results, encounter notes, upcoming appointments, etc.  Non-urgent messages can be sent to your provider as well.   To learn more about what you can do with MyChart, go to [email or web address].    Your next appointment:   3 month(s)  The format for your next appointment:   In Person  Provider:   Robert Krasowski, MD    Other Instructions NA  

## 2022-07-05 NOTE — Progress Notes (Signed)
Cardiology Office Note:    Date:  07/05/2022   ID:  Stephanie, Cobb December 13, 1960, MRN [id number]  PCP:  Lowella Dandy, NP  Cardiologist:  Jenne Campus, MD    Referring MD: Lowella Dandy, NP   Chief Complaint  Patient presents with   Medication Management    History of Present Illness:    Stephanie Cobb is a 62 y.o. female with past medical history significant for chest pain, dyslipidemia, essential hypertension.  Stress test done which was negative she was still having some symptoms calcium score has been performed which showed only 14 in proximal LAD which was 66 percentile.  She comes today to my office for follow-up.  Overall she is doing better but she complain of having some palpitations that happen about twice a week.  She said it is a very stressful time for her she is doing taxes and is very busy right now.  Denies having any exertional chest pain tightness squeezing pressure burning chest.  Does have a problem with the knee as well.  Past Medical History:  Diagnosis Date   ADD (attention deficit disorder)    Anemia    Anxiety    Arthritis    OA / PAIN BOTH HIPS   Depression    Expected blood loss anemia 02/24/2014   Fibromyalgia    GERD (gastroesophageal reflux disease)    Headache(784.0)    MIGRAINES   Heart palpitations    NUCLEAR STRESS TEST 12/14/13 AT [facility], EF 74%   Hypercholesterolemia 03/18/2018   Hypertension    Hypertensive disorder 03/18/2018   Hypertriglyceridemia    Hypothyroidism    Insomnia    Kidney insufficiency    PT STATES BUN SLIGHTLY ELEVATED - HER DOCTORS ARE WATCHING   Metatarsalgia of right foot 11/21/2015   Migraine 03/18/2018   MRSA (methicillin resistant Staphylococcus aureus)    hx of 2012- left upper leg - treated with Vancomycin    Overweight (BMI 25.0-29.9) 01/13/2014   Pain    CHRONIC NECK PAIN - DDD CERVICOTHORACIC; HX OF CERVICA FUSION - PT STATES 2 AREAS HAVE NOT FUSED AND SHE WILL NEED FURTHER SURGERY  IN THE FUTURE   Pain in joint of left knee 11/25/2020   Pneumonia    ABOUT 6 YRS AGO   PONV (postoperative nausea and vomiting)    S/P right THA, 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   Scoliosis     Past Surgical History:  Procedure Laterality Date   ABDOMINAL HYSTERECTOMY     CERVICAL FUSION  2011   TOTAL HIP ARTHROPLASTY Left 01/12/2014   Procedure: LEFT TOTAL HIP ARTHROPLASTY ANTERIOR APPROACH;  Surgeon: Mauri Pole, MD;  Location: [facility];  Service: Orthopedics;  Laterality: Left;   TOTAL HIP ARTHROPLASTY Right 02/23/2014   Procedure: RIGHT TOTAL HIP ARTHROPLASTY ANTERIOR APPROACH;  Surgeon: Mauri Pole, MD;  Location: [facility];  Service: Orthopedics;  Laterality: Right;   TUBAL LIGATION      Current Medications: Current Meds  Medication Sig   amLODipine (NORVASC) 5 MG tablet Take 10 mg by mouth in the morning and at bedtime.   aspirin EC 81 MG tablet Take 1 tablet (81 mg total) by mouth daily. Swallow whole.   carisoprodol (SOMA) 350 MG tablet Take 350 mg by mouth at bedtime.    cloNIDine (CATAPRES) 0.1 MG tablet Take 0.1 mg by mouth as needed (BP 170or >).   cyclobenzaprine (FLEXERIL) 5 MG tablet Take 5 mg by mouth at bedtime.  diazepam (VALIUM) 5 MG tablet Take 5 mg by mouth every 8 (eight) hours as needed for muscle spasms (headaches).   DULoxetine (CYMBALTA) 60 MG capsule Take 60 mg by mouth every morning.   estradiol (ESTRACE) 2 MG tablet Take 1 mg by mouth daily. Taking 1/2 tablet daily   gabapentin (NEURONTIN) 300 MG capsule Take 300 mg by mouth every 4 (four) hours as needed (Nerve pain).   levothyroxine (SYNTHROID) 75 MCG tablet Take 75 mcg by mouth daily.   Magnesium 250 MG TABS Take 2 tablets by mouth daily.   montelukast (SINGULAIR) 10 MG tablet Take 10 mg by mouth every morning.    Multiple Vitamins-Minerals (CENTRUM WOMEN PO) Take 1 tablet by mouth in the morning and at bedtime.   nabumetone (RELAFEN) 500 MG tablet Take 500 mg by mouth 2 (two) times daily.   OVER THE COUNTER  MEDICATION Take 1 drop by mouth as needed (back pain). CBD oil 10 drops bid   pantoprazole (PROTONIX) 40 MG tablet Take 40 mg by mouth at bedtime.    pravastatin (PRAVACHOL) 20 MG tablet Take 1 tablet (20 mg total) by mouth every evening.   ramipril (ALTACE) 5 MG capsule Take 5-10 mg by mouth 2 (two) times daily.   rizatriptan (MAXALT-MLT) 10 MG disintegrating tablet Take 10 mg by mouth every 2 (two) hours as needed for migraine. May repeat in 2 hours if needed     Allergies:   Mupirocin, Other, Oxycodone, and Vicodin [hydrocodone-acetaminophen]   Social History   Socioeconomic History   Marital status: Married    Spouse name: Not on file   Number of children: Not on file   Years of education: Not on file   Highest education level: Not on file  Occupational History   Not on file  Tobacco Use   Smoking status: Former    Packs/day: 1.00    Years: 10.00    Total pack years: 10.00    Types: Cigarettes   Smokeless tobacco: Never  Substance and Sexual Activity   Alcohol use: Not Currently    Comment: Socially   Drug use: Never   Sexual activity: Yes  Other Topics Concern   Not on file  Social History Narrative   Not on file   Social Determinants of Health   Financial Resource Strain: Not on file  Food Insecurity: Not on file  Transportation Needs: Not on file  Physical Activity: Not on file  Stress: Not on file  Social Connections: Not on file     Family History: The patient's family history is not on file. ROS:   Please see the history of present illness.    All 14 point review of systems negative except as described per history of present illness  EKGs/Labs/Other Studies Reviewed:      Recent Labs: No results found for requested labs within last 365 days.  Recent Lipid Panel No results found for: "CHOL", "TRIG", "HDL", "CHOLHDL", "VLDL", "LDLCALC", "LDLDIRECT"  Physical Exam:    VS:  BP (!) 150/82 (BP Location: Left Arm, Patient Position: Sitting)   Pulse  82   Ht 5\' 6"  (1.676 m)   Wt 180 lb 3.2 oz (81.7 kg)   SpO2 91%   BMI 29.09 kg/m     Wt Readings from Last 3 Encounters:  07/05/22 180 lb 3.2 oz (81.7 kg)  03/06/22 171 lb (77.6 kg)  11/29/21 173 lb 12.8 oz (78.8 kg)     GEN:  Well nourished, well developed in no  acute distress HEENT: Normal NECK: No JVD; No carotid bruits LYMPHATICS: No lymphadenopathy CARDIAC: RRR, no murmurs, no rubs, no gallops RESPIRATORY:  Clear to auscultation without rales, wheezing or rhonchi  ABDOMEN: Soft, non-tender, non-distended MUSCULOSKELETAL:  No edema; No deformity  SKIN: Warm and dry LOWER EXTREMITIES: no swelling NEUROLOGIC:  Alert and oriented x 3 PSYCHIATRIC:  Normal affect   ASSESSMENT:    1. Angina pectoris (HCC)   2. Elevated coronary artery calcium score   3. Primary hypertension   4. Dyslipidemia    PLAN:    In order of problems listed above:  Atypical chest pain all test so far negative.  Her calcium score is very mild.  Today she described to have palpitation ask her to wear Zio patch to see what can of arrhythmia with dealing with Evaluate calcium score, risk factors modifications she is taking pravastatin but she complain of having some muscle aches I asked her to stop for 2 weeks and see what happens if she is better then we will start her on Zetia. Essential hypertension blood pressure seems to be controlled continue present management. Dyslipidemia discussion as above. Kidney dysfunction her creatinine is mildly elevated I will recheck her kidney function today   Medication Adjustments/Labs and Tests Ordered: Current medicines are reviewed at length with the patient today.  Concerns regarding medicines are outlined above.  No orders of the defined types were placed in this encounter.  Medication changes: No orders of the defined types were placed in this encounter.   Signed, Park Liter, MD, [facility] 07/05/2022 11:49 AM    [place]

## 2022-07-05 NOTE — Addendum Note (Signed)
Addended by: Jacobo Forest D on: 07/05/2022 11:58 AM   Modules accepted: Orders

## 2022-07-06 LAB — BASIC METABOLIC PANEL
BUN/Creatinine Ratio: 14 (ref 12–28)
BUN: 16 mg/dL (ref 8–27)
CO2: 23 mmol/L (ref 20–29)
Calcium: 9.1 mg/dL (ref 8.7–10.3)
Chloride: 101 mmol/L (ref 96–106)
Creatinine, Ser: 1.12 mg/dL — ABNORMAL HIGH (ref 0.57–1.00)
Glucose: 86 mg/dL (ref 70–99)
Potassium: 4.9 mmol/L (ref 3.5–5.2)
Sodium: 139 mmol/L (ref 134–144)
eGFR: 55 mL/min/{1.73_m2} — ABNORMAL LOW (ref >59)

## 2022-07-12 ENCOUNTER — Telehealth: Payer: Self-pay

## 2022-07-12 DIAGNOSIS — I10 Essential (primary) hypertension: Secondary | ICD-10-CM

## 2022-07-12 NOTE — Telephone Encounter (Signed)
Results reviewed with pt as per Dr. Krasowski's note.  Pt verbalized understanding and had no additional questions. Routed to PCP  

## 2022-09-20 DIAGNOSIS — N289 Disorder of kidney and ureter, unspecified: Secondary | ICD-10-CM | POA: Diagnosis not present

## 2022-09-20 DIAGNOSIS — F330 Major depressive disorder, recurrent, mild: Secondary | ICD-10-CM | POA: Diagnosis not present

## 2022-09-20 DIAGNOSIS — R7309 Other abnormal glucose: Secondary | ICD-10-CM | POA: Diagnosis not present

## 2022-09-20 DIAGNOSIS — M797 Fibromyalgia: Secondary | ICD-10-CM | POA: Diagnosis not present

## 2022-09-20 DIAGNOSIS — E785 Hyperlipidemia, unspecified: Secondary | ICD-10-CM | POA: Diagnosis not present

## 2022-09-20 DIAGNOSIS — I10 Essential (primary) hypertension: Secondary | ICD-10-CM | POA: Diagnosis not present

## 2022-09-20 DIAGNOSIS — M2550 Pain in unspecified joint: Secondary | ICD-10-CM | POA: Diagnosis not present

## 2022-09-25 DIAGNOSIS — M419 Scoliosis, unspecified: Secondary | ICD-10-CM | POA: Diagnosis not present

## 2022-09-25 DIAGNOSIS — M5416 Radiculopathy, lumbar region: Secondary | ICD-10-CM | POA: Diagnosis not present

## 2022-09-25 DIAGNOSIS — G8929 Other chronic pain: Secondary | ICD-10-CM | POA: Diagnosis not present

## 2022-09-25 DIAGNOSIS — Z79899 Other long term (current) drug therapy: Secondary | ICD-10-CM | POA: Diagnosis not present

## 2022-09-25 DIAGNOSIS — Z5181 Encounter for therapeutic drug level monitoring: Secondary | ICD-10-CM | POA: Diagnosis not present

## 2022-09-25 DIAGNOSIS — R296 Repeated falls: Secondary | ICD-10-CM | POA: Insufficient documentation

## 2022-09-25 HISTORY — DX: Repeated falls: R29.6

## 2022-10-03 ENCOUNTER — Encounter: Payer: Self-pay | Admitting: Cardiology

## 2022-10-03 ENCOUNTER — Ambulatory Visit: Payer: BC Managed Care – PPO | Attending: Cardiology | Admitting: Cardiology

## 2022-10-03 VITALS — BP 142/80 | HR 85 | Ht 65.0 in | Wt 177.2 lb

## 2022-10-03 DIAGNOSIS — I10 Essential (primary) hypertension: Secondary | ICD-10-CM

## 2022-10-03 DIAGNOSIS — R931 Abnormal findings on diagnostic imaging of heart and coronary circulation: Secondary | ICD-10-CM

## 2022-10-03 DIAGNOSIS — E785 Hyperlipidemia, unspecified: Secondary | ICD-10-CM | POA: Diagnosis not present

## 2022-10-03 MED ORDER — EZETIMIBE 10 MG PO TABS: 10.0000 mg | ORAL_TABLET | Freq: Every day | ORAL | 3 refills | Status: DC

## 2022-10-03 NOTE — Patient Instructions (Addendum)
Medication Instructions:   START: Zetia 10mg  1 tablet daily   Lab Work: Your physician recommends that you return for lab work in: 6 weeks You need to have labs done when you are fasting.  You can come Monday through Friday 8:30 am to 12:00 pm and 1:15 to 4:30. You do not need to make an appointment as the order has already been placed. The labs you are going to have done are AST, ALT,  Lipids.    Testing/Procedures: None Ordered   Follow-Up: At [facility], you and your health needs are our priority.  As part of our continuing mission to provide you with exceptional heart care, we have created designated Provider Care Teams.  These Care Teams include your primary Cardiologist (physician) and Advanced Practice Providers (APPs -  Physician Assistants and Nurse Practitioners) who all work together to provide you with the care you need, when you need it.  We recommend signing up for the patient portal called "MyChart".  Sign up information is provided on this After Visit Summary.  MyChart is used to connect with patients for Virtual Visits (Telemedicine).  Patients are able to view lab/test results, encounter notes, upcoming appointments, etc.  Non-urgent messages can be sent to your provider as well.   To learn more about what you can do with MyChart, go to [email or web address].    Your next appointment:   3 month(s)  The format for your next appointment:   In Person  Provider:   Gypsy Balsam, MD    Other Instructions NA as

## 2022-10-03 NOTE — Progress Notes (Signed)
Cardiology Office Note:    Date:  10/03/2022   ID:  DAVEDA LAROCK, DOB [birth year], MRN [id number]  PCP:  Hurshel Party, NP  Cardiologist:  Gypsy Balsam, MD    Referring MD: Hurshel Party, NP   Chief Complaint  Patient presents with   Shortness of Breath    History of Present Illness:    Stephanie Cobb is a 62 y.o. female with past medical history significant for chest pain, dyslipidemia, essential hypertension, she did have a stress test done which was negative in spite of that she still having some symptoms after that calcium score has been performed which was only 14 weight was 66 percentile.  She is coming today to months for follow-up she said she denies have any chest pain tightness squeezing pressure burning chest chest fatigue tiredness and shortness of breath.  Past Medical History:  Diagnosis Date   ADD (attention deficit disorder)    Anemia    Anxiety    Arthritis    OA / PAIN BOTH HIPS   Depression    Expected blood loss anemia 02/24/2014   Fibromyalgia    GERD (gastroesophageal reflux disease)    Headache(784.0)    MIGRAINES   Heart palpitations    NUCLEAR STRESS TEST 12/14/13 AT [facility]- NO ISCHEMIA, EF 74%   Hypercholesterolemia 03/18/2018   Hypertension    Hypertensive disorder 03/18/2018   Hypertriglyceridemia    Hypothyroidism    Insomnia    Kidney insufficiency    PT STATES BUN SLIGHTLY ELEVATED - HER DOCTORS ARE WATCHING   Metatarsalgia of right foot 11/21/2015   Migraine 03/18/2018   MRSA (methicillin resistant Staphylococcus aureus)    hx of 2012- left upper leg - treated with Vancomycin    Overweight (BMI 25.0-29.9) 01/13/2014   Pain    CHRONIC NECK PAIN - DDD CERVICOTHORACIC; HX OF CERVICA FUSION - PT STATES 2 AREAS HAVE NOT FUSED AND SHE WILL NEED FURTHER SURGERY IN THE FUTURE   Pain in joint of left knee 11/25/2020   Pneumonia    ABOUT 6 YRS AGO   PONV (postoperative nausea and vomiting)    S/P right THA, 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   Scoliosis      Past Surgical History:  Procedure Laterality Date   ABDOMINAL HYSTERECTOMY     CERVICAL FUSION  2011   TOTAL HIP ARTHROPLASTY Left 01/12/2014   Procedure: LEFT TOTAL HIP ARTHROPLASTY ANTERIOR APPROACH;  Surgeon: Shelda Pal, MD;  Location: [facility];  Service: Orthopedics;  Laterality: Left;   TOTAL HIP ARTHROPLASTY Right 02/23/2014   Procedure: RIGHT TOTAL HIP ARTHROPLASTY ANTERIOR APPROACH;  Surgeon: Shelda Pal, MD;  Location: [facility];  Service: Orthopedics;  Laterality: Right;   TUBAL LIGATION      Current Medications: Current Meds  Medication Sig   amLODipine (NORVASC) 5 MG tablet Take 10 mg by mouth in the morning and at bedtime.   aspirin EC 81 MG tablet Take 1 tablet (81 mg total) by mouth daily. Swallow whole.   buPROPion (WELLBUTRIN XL) 300 MG 24 hr tablet Take 300 mg by mouth every morning.   carisoprodol (SOMA) 350 MG tablet Take 350 mg by mouth at bedtime.    cloNIDine (CATAPRES) 0.1 MG tablet Take 0.1 mg by mouth as needed (BP 170or >).   cyclobenzaprine (FLEXERIL) 5 MG tablet Take 5 mg by mouth at bedtime.   diazepam (VALIUM) 5 MG tablet Take 5 mg by mouth every 8 (eight) hours as needed for muscle  spasms (headaches).   DULoxetine (CYMBALTA) 60 MG capsule Take 90 mg by mouth every morning.   estradiol (ESTRACE) 2 MG tablet Take 1 mg by mouth daily. Taking 1/2 tablet daily   gabapentin (NEURONTIN) 300 MG capsule Take 300 mg by mouth every 4 (four) hours as needed (Nerve pain).   levothyroxine (SYNTHROID) 75 MCG tablet Take 75 mcg by mouth daily.   Magnesium 250 MG TABS Take 1 tablet by mouth daily.   montelukast (SINGULAIR) 10 MG tablet Take 10 mg by mouth every morning.    Multiple Vitamins-Minerals (CENTRUM WOMEN PO) Take 1 tablet by mouth in the morning and at bedtime.   nabumetone (RELAFEN) 500 MG tablet Take 500 mg by mouth 2 (two) times daily.   OVER THE COUNTER MEDICATION Take 1 drop by mouth as needed (back pain). CBD oil 10 drops bid   pantoprazole  (PROTONIX) 40 MG tablet Take 40 mg by mouth at bedtime.    ramipril (ALTACE) 5 MG capsule Take 5-10 mg by mouth 2 (two) times daily.   rizatriptan (MAXALT-MLT) 10 MG disintegrating tablet Take 10 mg by mouth every 2 (two) hours as needed for migraine. May repeat in 2 hours if needed   [DISCONTINUED] pravastatin (PRAVACHOL) 20 MG tablet Take 1 tablet (20 mg total) by mouth every evening.     Allergies:   Mupirocin, Other, Oxycodone, and Vicodin [hydrocodone-acetaminophen]   Social History   Socioeconomic History   Marital status: Married    Spouse name: Not on file   Number of children: Not on file   Years of education: Not on file   Highest education level: Not on file  Occupational History   Not on file  Tobacco Use   Smoking status: Former    Packs/day: 1.00    Years: 10.00    Additional pack years: 0.00    Total pack years: 10.00    Types: Cigarettes   Smokeless tobacco: Never  Substance and Sexual Activity   Alcohol use: Not Currently    Comment: Socially   Drug use: Never   Sexual activity: Yes  Other Topics Concern   Not on file  Social History Narrative   Not on file   Social Determinants of Health   Financial Resource Strain: Not on file  Food Insecurity: Not on file  Transportation Needs: Not on file  Physical Activity: Not on file  Stress: Not on file  Social Connections: Not on file     Family History: The patient's family history is not on file. ROS:   Please see the history of present illness.    All 14 point review of systems negative except as described per history of present illness  EKGs/Labs/Other Studies Reviewed:      Recent Labs: 07/05/2022: BUN 16; Creatinine, Ser 1.12; Potassium 4.9; Sodium 139  Recent Lipid Panel No results found for: "CHOL", "TRIG", "HDL", "CHOLHDL", "VLDL", "LDLCALC", "LDLDIRECT"  Physical Exam:    VS:  BP (!) 142/80 (BP Location: Left Arm, Patient Position: Sitting)   Pulse 85   Ht 5\' 5"  (1.651 m)   Wt 177 lb  3.2 oz (80.4 kg)   SpO2 94%   BMI 29.49 kg/m     Wt Readings from Last 3 Encounters:  10/03/22 177 lb 3.2 oz (80.4 kg)  07/05/22 180 lb 3.2 oz (81.7 kg)  03/06/22 171 lb (77.6 kg)     GEN:  Well nourished, well developed in no acute distress HEENT: Normal NECK: No JVD; No carotid bruits  LYMPHATICS: No lymphadenopathy CARDIAC: RRR, no murmurs, no rubs, no gallops RESPIRATORY:  Clear to auscultation without rales, wheezing or rhonchi  ABDOMEN: Soft, non-tender, non-distended MUSCULOSKELETAL:  No edema; No deformity  SKIN: Warm and dry LOWER EXTREMITIES: no swelling NEUROLOGIC:  Alert and oriented x 3 PSYCHIATRIC:  Normal affect   ASSESSMENT:    1. Elevated coronary artery calcium score   2. Dyslipidemia   3. Primary hypertension    PLAN:    In order of problems listed above:  Elevated calcium score with normal stress test.  She is doing well but does have some exertional shortness of breath she admits that she sits on the chair all the time I encouraged her to BL be more active. Dyslipidemia she is intolerant to pravastatin.  I will put her on Zetia 10 mg daily, fasting lipid profile, AST LT will be done in 6 weeks.  I did review K PN which show me her LDL 116 HDL 74 this is from September 20, 2022. Essential hypertension blood pressure reasonably controlled continue present management   Medication Adjustments/Labs and Tests Ordered: Current medicines are reviewed at length with the patient today.  Concerns regarding medicines are outlined above.  No orders of the defined types were placed in this encounter.  Medication changes: No orders of the defined types were placed in this encounter.   Signed, Georgeanna Lea, MD, [facility] 10/03/2022 3:29 PM    [facility]

## 2022-10-03 NOTE — Addendum Note (Signed)
Addended by: Baldo Ash D on: 10/03/2022 03:44 PM   Modules accepted: Orders

## 2022-10-19 DIAGNOSIS — F330 Major depressive disorder, recurrent, mild: Secondary | ICD-10-CM | POA: Diagnosis not present

## 2022-10-19 DIAGNOSIS — M797 Fibromyalgia: Secondary | ICD-10-CM | POA: Diagnosis not present

## 2022-10-19 DIAGNOSIS — I10 Essential (primary) hypertension: Secondary | ICD-10-CM | POA: Diagnosis not present

## 2022-10-19 DIAGNOSIS — E785 Hyperlipidemia, unspecified: Secondary | ICD-10-CM | POA: Diagnosis not present

## 2023-01-09 ENCOUNTER — Ambulatory Visit: Payer: BC Managed Care – PPO | Attending: Cardiology | Admitting: Cardiology

## 2023-01-09 ENCOUNTER — Encounter: Payer: Self-pay | Admitting: Cardiology

## 2023-01-09 VITALS — BP 140/72 | HR 77 | Ht 65.0 in | Wt 177.0 lb

## 2023-01-09 DIAGNOSIS — E785 Hyperlipidemia, unspecified: Secondary | ICD-10-CM

## 2023-01-09 DIAGNOSIS — I209 Angina pectoris, unspecified: Secondary | ICD-10-CM

## 2023-01-09 DIAGNOSIS — M79606 Pain in leg, unspecified: Secondary | ICD-10-CM

## 2023-01-09 DIAGNOSIS — M797 Fibromyalgia: Secondary | ICD-10-CM

## 2023-01-09 DIAGNOSIS — I10 Essential (primary) hypertension: Secondary | ICD-10-CM

## 2023-01-09 DIAGNOSIS — R931 Abnormal findings on diagnostic imaging of heart and coronary circulation: Secondary | ICD-10-CM | POA: Diagnosis not present

## 2023-01-09 NOTE — Progress Notes (Signed)
Cardiology Office Note:    Date:  01/09/2023   ID:  Stephanie Cobb [birth year], MRN [id number]  PCP:  Stephanie Party, NP  Cardiologist:  Stephanie Balsam, MD    Referring MD: Stephanie Party, NP   Chief Complaint  Patient presents with   Follow-up    History of Present Illness:    Stephanie Cobb is a 62 y.o. female with past medical history significant for atypical chest pain, dyslipidemia, fibromyalgia, essential hypertension, she did have a stress test done which was negative after that a calcium score was done which showed 14 which is 66 percentile. Comes today to months for follow-up overall doing very well.  Denies of any chest pain tightness squeezing pressure burning chest.  She describes however some heavy sensation in her legs when she walks to the point that sometimes he has to stop more about claudications.  He does not smoke smoke for probably about 15 years and quit smoking years ago  Past Medical History:  Diagnosis Date   ADD (attention deficit disorder)    Anemia    Anxiety    Arthritis    OA / PAIN BOTH HIPS   Depression    Expected blood loss anemia 02/24/2014   Fibromyalgia    GERD (gastroesophageal reflux disease)    Headache(784.0)    MIGRAINES   Heart palpitations    NUCLEAR STRESS TEST 12/14/13 AT [facility]- NO ISCHEMIA, EF 74%   Hypercholesterolemia 03/18/2018   Hypertension    Hypertensive disorder 03/18/2018   Hypertriglyceridemia    Hypothyroidism    Insomnia    Kidney insufficiency    PT STATES BUN SLIGHTLY ELEVATED - HER DOCTORS ARE WATCHING   Metatarsalgia of right foot 11/21/2015   Migraine 03/18/2018   MRSA (methicillin resistant Staphylococcus aureus)    hx of 2012- left upper leg - treated with Vancomycin    Overweight (BMI 25.0-29.9) 01/13/2014   Pain    CHRONIC NECK PAIN - DDD CERVICOTHORACIC; HX OF CERVICA FUSION - PT STATES 2 AREAS HAVE NOT FUSED AND SHE WILL NEED FURTHER SURGERY IN THE FUTURE   Pain in joint of left knee  11/25/2020   Pneumonia    ABOUT 6 YRS AGO   PONV (postoperative nausea and vomiting)    S/P right THA, 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   Scoliosis     Past Surgical History:  Procedure Laterality Date   ABDOMINAL HYSTERECTOMY     CERVICAL FUSION  2011   TOTAL HIP ARTHROPLASTY Left 01/12/2014   Procedure: LEFT TOTAL HIP ARTHROPLASTY ANTERIOR APPROACH;  Surgeon: Shelda Pal, MD;  Location: [facility];  Service: Orthopedics;  Laterality: Left;   TOTAL HIP ARTHROPLASTY Right 02/23/2014   Procedure: RIGHT TOTAL HIP ARTHROPLASTY ANTERIOR APPROACH;  Surgeon: Shelda Pal, MD;  Location: [facility];  Service: Orthopedics;  Laterality: Right;   TUBAL LIGATION      Current Medications: Current Meds  Medication Sig   amLODipine (NORVASC) 5 MG tablet Take 10 mg by mouth in the morning and at bedtime.   aspirin EC 81 MG tablet Take 1 tablet (81 mg total) by mouth daily. Swallow whole.   buPROPion (WELLBUTRIN XL) 300 MG 24 hr tablet Take 300 mg by mouth every morning.   carisoprodol (SOMA) 350 MG tablet Take 350 mg by mouth at bedtime.    cloNIDine (CATAPRES) 0.1 MG tablet Take 0.1 mg by mouth as needed (BP 170or >).   cyclobenzaprine (FLEXERIL) 5 MG tablet Take 5 mg by  mouth at bedtime.   diazepam (VALIUM) 5 MG tablet Take 5 mg by mouth every 8 (eight) hours as needed for muscle spasms (headaches).   DULoxetine (CYMBALTA) 60 MG capsule Take 90 mg by mouth every morning.   estradiol (ESTRACE) 2 MG tablet Take 1 mg by mouth daily. Taking 1/2 tablet daily   ezetimibe (ZETIA) 10 MG tablet Take 1 tablet (10 mg total) by mouth daily.   gabapentin (NEURONTIN) 300 MG capsule Take 300 mg by mouth every 4 (four) hours as needed (Nerve pain).   levothyroxine (SYNTHROID) 75 MCG tablet Take 75 mcg by mouth daily.   linaCLOtide (LINZESS PO) Take 1 tablet by mouth daily.   Magnesium 250 MG TABS Take 1 tablet by mouth daily.   montelukast (SINGULAIR) 10 MG tablet Take 10 mg by mouth every morning.    Multiple Vitamins-Minerals  (CENTRUM WOMEN PO) Take 1 tablet by mouth in the morning and at bedtime.   nabumetone (RELAFEN) 500 MG tablet Take 500 mg by mouth 2 (two) times daily.   OVER THE COUNTER MEDICATION Take 1 drop by mouth as needed (back pain). CBD oil 10 drops bid   pantoprazole (PROTONIX) 40 MG tablet Take 40 mg by mouth at bedtime.    ramipril (ALTACE) 5 MG capsule Take 5-10 mg by mouth 2 (two) times daily.   rizatriptan (MAXALT-MLT) 10 MG disintegrating tablet Take 10 mg by mouth every 2 (two) hours as needed for migraine. May repeat in 2 hours if needed     Allergies:   Mupirocin, Other, Oxycodone, and Vicodin [hydrocodone-acetaminophen]   Social History   Socioeconomic History   Marital status: Married    Spouse name: Not on file   Number of children: Not on file   Years of education: Not on file   Highest education level: Not on file  Occupational History   Not on file  Tobacco Use   Smoking status: Former    Current packs/day: 1.00    Average packs/day: 1 pack/day for 10.0 years (10.0 ttl pk-yrs)    Types: Cigarettes   Smokeless tobacco: Never  Substance and Sexual Activity   Alcohol use: Not Currently    Comment: Socially   Drug use: Never   Sexual activity: Yes  Other Topics Concern   Not on file  Social History Narrative   Not on file   Social Determinants of Health   Financial Resource Strain: Not on file  Food Insecurity: Not on file  Transportation Needs: Not on file  Physical Activity: Not on file  Stress: Not on file  Social Connections: Unknown (10/17/2021)   Received from [facility]   Social Network    Social Network: Not on file     Family History: The patient's family history is not on file. ROS:   Please see the history of present illness.    All 14 point review of systems negative except as described per history of present illness  EKGs/Labs/Other Studies Reviewed:    EKG Interpretation Date/Time:  Wednesday January 09 2023 14:13:38  EDT Ventricular Rate:  77 PR Interval:  190 QRS Duration:  84 QT Interval:  354 QTC Calculation: 400 R Axis:   -29  Text Interpretation: Normal sinus rhythm Septal infarct , age undetermined Abnormal ECG When compared with ECG of 25-May-2019 20:25, No significant change was found Confirmed by Stephanie Cobb [phone]) on 01/09/2023 2:17:50 PM    Recent Labs: 07/05/2022: BUN 16; Creatinine, Ser 1.12; Potassium 4.9; Sodium 139  Recent Lipid Panel No results found for: "CHOL", "TRIG", "HDL", "CHOLHDL", "VLDL", "LDLCALC", "LDLDIRECT"  Physical Exam:    VS:  BP (!) 140/72 (BP Location: Left Arm, Patient Position: Sitting)   Pulse 77   Ht 5\' 5"  (1.651 m)   Wt 177 lb (80.3 kg)   SpO2 92%   BMI 29.45 kg/m     Wt Readings from Last 3 Encounters:  01/09/23 177 lb (80.3 kg)  10/03/22 177 lb 3.2 oz (80.4 kg)  07/05/22 180 lb 3.2 oz (81.7 kg)     GEN:  Well nourished, well developed in no acute distress HEENT: Normal NECK: No JVD; No carotid bruits LYMPHATICS: No lymphadenopathy CARDIAC: RRR, no murmurs, no rubs, no gallops RESPIRATORY:  Clear to auscultation without rales, wheezing or rhonchi  ABDOMEN: Soft, non-tender, non-distended MUSCULOSKELETAL:  No edema; No deformity  SKIN: Warm and dry LOWER EXTREMITIES: no swelling NEUROLOGIC:  Alert and oriented x 3 PSYCHIATRIC:  Normal affect   ASSESSMENT:    1. Elevated coronary artery calcium score   2. Angina pectoris (HCC)   3. Primary hypertension   4. Dyslipidemia   5. Fibromyalgia    PLAN:    In order of problems listed above:  Elevated coronary calcium score.  Asymptomatic denies of any chest pain tightness squeezing pressure burning chest, key is risk factors modifications.  She is on antiplatelet therapy which I will continue she is on statin. Dyslipidemia she is here today to have fasting lipid profile done she is on high intense statin as well as Zetia which I will continue. Pain in her lower extremities suspicion for  claudications.  Will schedule her to have ABIs done. Fibromyalgia may be contributing to her symptomatology   Medication Adjustments/Labs and Tests Ordered: Current medicines are reviewed at length with the patient today.  Concerns regarding medicines are outlined above.  Orders Placed This Encounter  Procedures   EKG 12-Lead   Medication changes: No orders of the defined types were placed in this encounter.   Signed, Georgeanna Lea, MD, [facility] 01/09/2023 2:30 PM    [facility]

## 2023-01-09 NOTE — Addendum Note (Signed)
Addended by: Baldo Ash D on: 01/09/2023 02:40 PM   Modules accepted: Orders

## 2023-01-09 NOTE — Patient Instructions (Signed)
Medication Instructions:  Your physician recommends that you continue on your current medications as directed. Please refer to the Current Medication list given to you today.  *If you need a refill on your cardiac medications before your next appointment, please call your pharmacy*   Lab Work: Lipid, AST, ALT, BMP- today If you have labs (blood work) drawn today and your tests are completely normal, you will receive your results only by: MyChart Message (if you have MyChart) OR A paper copy in the mail If you have any lab test that is abnormal or we need to change your treatment, we will call you to review the results.   Testing/Procedures: Your physician has requested that you have a lower or upper extremity venous duplex. This test is an ultrasound of the veins in the legs or arms. It looks at venous blood flow that carries blood from the heart to the legs or arms. Allow one hour for a Lower Venous exam. Allow thirty minutes for an Upper Venous exam. There are no restrictions or special instructions.     Follow-Up: At [facility], you and your health needs are our priority.  As part of our continuing mission to provide you with exceptional heart care, we have created designated Provider Care Teams.  These Care Teams include your primary Cardiologist (physician) and Advanced Practice Providers (APPs -  Physician Assistants and Nurse Practitioners) who all work together to provide you with the care you need, when you need it.  We recommend signing up for the patient portal called "MyChart".  Sign up information is provided on this After Visit Summary.  MyChart is used to connect with patients for Virtual Visits (Telemedicine).  Patients are able to view lab/test results, encounter notes, upcoming appointments, etc.  Non-urgent messages can be sent to your provider as well.   To learn more about what you can do with MyChart, go to [email or web address].    Your next appointment:   6  month(s)  The format for your next appointment:   In Person  Provider:   Gypsy Balsam, MD    Other Instructions NA

## 2023-01-10 ENCOUNTER — Telehealth: Payer: Self-pay

## 2023-01-10 DIAGNOSIS — E785 Hyperlipidemia, unspecified: Secondary | ICD-10-CM

## 2023-01-10 DIAGNOSIS — R931 Abnormal findings on diagnostic imaging of heart and coronary circulation: Secondary | ICD-10-CM

## 2023-01-10 NOTE — Telephone Encounter (Signed)
Patient notified through my chart, awaiting response from the patient on treatment plan.

## 2023-01-10 NOTE — Telephone Encounter (Signed)
-----   Message from Gypsy Balsam sent at 01/10/2023  9:40 AM EDT ----- Stephanie Cobb needs to be better, please switch Zetia to Nexlizet, fasting lipid profile AST LT 6 weeks

## 2023-01-11 MED ORDER — NEXLIZET 180-10 MG PO TABS: 1.0000 | ORAL_TABLET | Freq: Every day | ORAL | 1 refills | Status: DC

## 2023-01-11 NOTE — Addendum Note (Signed)
Addended by: Heywood Bene on: 01/11/2023 04:50 PM   Modules accepted: Orders

## 2023-01-14 ENCOUNTER — Encounter: Payer: Self-pay | Admitting: Cardiology

## 2023-01-24 DIAGNOSIS — M419 Scoliosis, unspecified: Secondary | ICD-10-CM | POA: Diagnosis not present

## 2023-01-24 DIAGNOSIS — G8929 Other chronic pain: Secondary | ICD-10-CM | POA: Diagnosis not present

## 2023-01-24 DIAGNOSIS — R296 Repeated falls: Secondary | ICD-10-CM | POA: Diagnosis not present

## 2023-01-24 DIAGNOSIS — M5416 Radiculopathy, lumbar region: Secondary | ICD-10-CM | POA: Diagnosis not present

## 2023-01-29 DIAGNOSIS — F330 Major depressive disorder, recurrent, mild: Secondary | ICD-10-CM | POA: Diagnosis not present

## 2023-01-29 DIAGNOSIS — E039 Hypothyroidism, unspecified: Secondary | ICD-10-CM | POA: Diagnosis not present

## 2023-01-29 DIAGNOSIS — M797 Fibromyalgia: Secondary | ICD-10-CM | POA: Diagnosis not present

## 2023-01-29 DIAGNOSIS — I10 Essential (primary) hypertension: Secondary | ICD-10-CM | POA: Diagnosis not present

## 2023-01-30 ENCOUNTER — Ambulatory Visit: Payer: BC Managed Care – PPO | Attending: Cardiology

## 2023-01-30 DIAGNOSIS — M79606 Pain in leg, unspecified: Secondary | ICD-10-CM

## 2023-01-30 DIAGNOSIS — M79604 Pain in right leg: Secondary | ICD-10-CM

## 2023-01-30 DIAGNOSIS — M79605 Pain in left leg: Secondary | ICD-10-CM

## 2023-01-30 LAB — VAS US ABI WITH/WO TBI
Left ABI: 1.04
Right ABI: 1.07

## 2023-02-06 ENCOUNTER — Telehealth: Payer: Self-pay

## 2023-02-06 NOTE — Telephone Encounter (Signed)
Patient notified through my chart.

## 2023-02-06 NOTE — Telephone Encounter (Signed)
-----   Message from Gypsy Balsam sent at 01/31/2023 10:02 AM EDT ----- Normal ABIs bilaterally

## 2023-02-08 ENCOUNTER — Encounter: Payer: Self-pay | Admitting: Cardiology

## 2023-02-11 DIAGNOSIS — Z6828 Body mass index (BMI) 28.0-28.9, adult: Secondary | ICD-10-CM | POA: Diagnosis not present

## 2023-02-11 DIAGNOSIS — N393 Stress incontinence (female) (male): Secondary | ICD-10-CM

## 2023-02-11 HISTORY — DX: Stress incontinence (female) (male): N39.3

## 2023-03-05 DIAGNOSIS — Z1231 Encounter for screening mammogram for malignant neoplasm of breast: Secondary | ICD-10-CM | POA: Diagnosis not present

## 2023-03-05 DIAGNOSIS — Z01419 Encounter for gynecological examination (general) (routine) without abnormal findings: Secondary | ICD-10-CM | POA: Diagnosis not present

## 2023-04-16 ENCOUNTER — Telehealth: Payer: Self-pay

## 2023-04-17 ENCOUNTER — Telehealth: Payer: Self-pay | Admitting: Pharmacy Technician

## 2023-04-17 ENCOUNTER — Other Ambulatory Visit (HOSPITAL_COMMUNITY): Payer: Self-pay

## 2023-04-17 NOTE — Telephone Encounter (Signed)
Pharmacy Patient Advocate Encounter   Received notification from Pt Calls Messages that prior authorization for nexlizet is required/requested.   Insurance verification completed.   The patient is insured through [facility] .   Per test claim: PA required; PA submitted to above mentioned insurance via CoverMyMeds Key/confirmation #/EOC [id number] Status is pending

## 2023-04-17 NOTE — Telephone Encounter (Signed)
Pharmacy Patient Advocate Encounter  Received notification from [facility] that Prior Authorization for nexlizet has been APPROVED from 04/17/23 to 04/16/24. Ran test claim, Copay is $75.00- 3 months. This test claim was processed through [facility]- copay amounts may vary at other pharmacies due to pharmacy/plan contracts, or as the patient moves through the different stages of their insurance plan.   PA #/Case ID/Reference #: [id number]

## 2023-04-19 NOTE — Telephone Encounter (Signed)
Spoke with patien notified of approval however, patient d/c Nexlizet due to muscle aches and weakness. She also reported  episode of high BP. She took clonidine and BP has been fine ever since. I advise the patient to keep monitoring her BP, report any consistent spikes of BP. I also have her on Dr. Kirtland Bouchard schedule 05/06/23 for a follow up on cholesterol meds and BP check.

## 2023-05-03 DIAGNOSIS — R7309 Other abnormal glucose: Secondary | ICD-10-CM | POA: Diagnosis not present

## 2023-05-03 DIAGNOSIS — R519 Headache, unspecified: Secondary | ICD-10-CM | POA: Diagnosis not present

## 2023-05-03 DIAGNOSIS — E559 Vitamin D deficiency, unspecified: Secondary | ICD-10-CM | POA: Diagnosis not present

## 2023-05-03 DIAGNOSIS — E039 Hypothyroidism, unspecified: Secondary | ICD-10-CM | POA: Diagnosis not present

## 2023-05-03 DIAGNOSIS — N289 Disorder of kidney and ureter, unspecified: Secondary | ICD-10-CM | POA: Diagnosis not present

## 2023-05-03 DIAGNOSIS — G44229 Chronic tension-type headache, not intractable: Secondary | ICD-10-CM | POA: Diagnosis not present

## 2023-05-03 DIAGNOSIS — Z23 Encounter for immunization: Secondary | ICD-10-CM | POA: Diagnosis not present

## 2023-05-03 DIAGNOSIS — M797 Fibromyalgia: Secondary | ICD-10-CM | POA: Diagnosis not present

## 2023-05-03 DIAGNOSIS — E785 Hyperlipidemia, unspecified: Secondary | ICD-10-CM | POA: Diagnosis not present

## 2023-05-03 DIAGNOSIS — I10 Essential (primary) hypertension: Secondary | ICD-10-CM | POA: Diagnosis not present

## 2023-05-06 ENCOUNTER — Encounter: Payer: Self-pay | Admitting: Cardiology

## 2023-05-06 ENCOUNTER — Ambulatory Visit: Payer: BC Managed Care – PPO | Attending: Cardiology | Admitting: Cardiology

## 2023-05-06 VITALS — BP 134/84 | HR 84 | Ht 66.0 in | Wt 173.8 lb

## 2023-05-06 DIAGNOSIS — H90A22 Sensorineural hearing loss, unilateral, left ear, with restricted hearing on the contralateral side: Secondary | ICD-10-CM | POA: Diagnosis not present

## 2023-05-06 DIAGNOSIS — R931 Abnormal findings on diagnostic imaging of heart and coronary circulation: Secondary | ICD-10-CM

## 2023-05-06 DIAGNOSIS — E785 Hyperlipidemia, unspecified: Secondary | ICD-10-CM | POA: Diagnosis not present

## 2023-05-06 DIAGNOSIS — I10 Essential (primary) hypertension: Secondary | ICD-10-CM

## 2023-05-06 NOTE — Progress Notes (Unsigned)
Cardiology Office Note:    Date:  05/06/2023   ID:  Stephanie, Cobb 06-16-1960, MRN [id number]  PCP:  Hurshel Party, NP  Cardiologist:  Gypsy Balsam, MD    Referring MD: Hurshel Party, NP   Chief Complaint  Patient presents with   Blood Pressure Check    History of Present Illness:    Stephanie Cobb is a 62 y.o. female  with past medical history significant for atypical chest pain, dyslipidemia, fibromyalgia, essential hypertension, she did have a stress test done which was negative after that a calcium score was done which showed 14 which is 66 percentile.  Comes today to my office for follow-up.  Cardiac wise seems to be doing well.  She complained of having some back pain.  No chest pain tightness squeezing pressure burning chest no palpitations  Past Medical History:  Diagnosis Date   ADD (attention deficit disorder)    Anemia    Anxiety    Arthritis    OA / PAIN BOTH HIPS   Depression    Expected blood loss anemia 02/24/2014   Fibromyalgia    GERD (gastroesophageal reflux disease)    Headache(784.0)    MIGRAINES   Heart palpitations    NUCLEAR STRESS TEST 12/14/13 AT [facility]- NO ISCHEMIA, EF 74%   Hypercholesterolemia 03/18/2018   Hypertension    Hypertensive disorder 03/18/2018   Hypertriglyceridemia    Hypothyroidism    Insomnia    Kidney insufficiency    PT STATES BUN SLIGHTLY ELEVATED - HER DOCTORS ARE WATCHING   Metatarsalgia of right foot 11/21/2015   Migraine 03/18/2018   MRSA (methicillin resistant Staphylococcus aureus)    hx of 2012- left upper leg - treated with Vancomycin    Overweight (BMI 25.0-29.9) 01/13/2014   Pain    CHRONIC NECK PAIN - DDD CERVICOTHORACIC; HX OF CERVICA FUSION - PT STATES 2 AREAS HAVE NOT FUSED AND SHE WILL NEED FURTHER SURGERY IN THE FUTURE   Pain in joint of left knee 11/25/2020   Pneumonia    ABOUT 6 YRS AGO   PONV (postoperative nausea and vomiting)    S/P right THA, 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   Scoliosis     Past  Surgical History:  Procedure Laterality Date   ABDOMINAL HYSTERECTOMY     CERVICAL FUSION  2011   TOTAL HIP ARTHROPLASTY Left 01/12/2014   Procedure: LEFT TOTAL HIP ARTHROPLASTY ANTERIOR APPROACH;  Surgeon: Shelda Pal, MD;  Location: [facility];  Service: Orthopedics;  Laterality: Left;   TOTAL HIP ARTHROPLASTY Right 02/23/2014   Procedure: RIGHT TOTAL HIP ARTHROPLASTY ANTERIOR APPROACH;  Surgeon: Shelda Pal, MD;  Location: [facility];  Service: Orthopedics;  Laterality: Right;   TUBAL LIGATION      Current Medications: Current Meds  Medication Sig   amLODipine (NORVASC) 5 MG tablet Take 10 mg by mouth in the morning and at bedtime.   aspirin EC 81 MG tablet Take 1 tablet (81 mg total) by mouth daily. Swallow whole.   buPROPion (WELLBUTRIN XL) 300 MG 24 hr tablet Take 300 mg by mouth every morning.   butalbital-apap-caffeine-codeine (FIORICET WITH CODEINE) 50-325-40-30 MG capsule Take 1 capsule by mouth as needed (pain).   carisoprodol (SOMA) 350 MG tablet Take 350 mg by mouth at bedtime.    cloNIDine (CATAPRES) 0.1 MG tablet Take 0.1 mg by mouth as needed (BP 170or >).   cyclobenzaprine (FLEXERIL) 5 MG tablet Take 5 mg by mouth at bedtime.   diazepam (VALIUM)  5 MG tablet Take 5 mg by mouth every 8 (eight) hours as needed for muscle spasms (headaches).   DULoxetine (CYMBALTA) 60 MG capsule Take 90 mg by mouth every morning.   estradiol (ESTRACE) 2 MG tablet Take 1 mg by mouth daily. Taking 1/2 tablet daily   gabapentin (NEURONTIN) 300 MG capsule Take 300 mg by mouth every 4 (four) hours as needed (Nerve pain).   levothyroxine (SYNTHROID) 75 MCG tablet Take 75 mcg by mouth daily.   linaCLOtide (LINZESS PO) Take 1 tablet by mouth as needed (constipation).   Magnesium 250 MG TABS Take 1 tablet by mouth daily.   montelukast (SINGULAIR) 10 MG tablet Take 10 mg by mouth every morning.    Multiple Vitamins-Minerals (CENTRUM WOMEN PO) Take 1 tablet by mouth in the morning and at bedtime.    nabumetone (RELAFEN) 500 MG tablet Take 500 mg by mouth 2 (two) times daily.   OVER THE COUNTER MEDICATION Take 1 drop by mouth as needed (back pain). CBD oil 10 drops bid   pantoprazole (PROTONIX) 40 MG tablet Take 40 mg by mouth at bedtime.    ramipril (ALTACE) 5 MG capsule Take 5-10 mg by mouth 2 (two) times daily.   rizatriptan (MAXALT-MLT) 10 MG disintegrating tablet Take 10 mg by mouth every 2 (two) hours as needed for migraine. May repeat in 2 hours if needed   [DISCONTINUED] Bempedoic Acid-Ezetimibe (NEXLIZET) 180-10 MG TABS Take 1 tablet by mouth daily.     Allergies:   Mupirocin, Nexlizet [bempedoic acid-ezetimibe], Other, Oxycodone, and Vicodin [hydrocodone-acetaminophen]   Social History   Socioeconomic History   Marital status: Married    Spouse name: Not on file   Number of children: Not on file   Years of education: Not on file   Highest education level: Not on file  Occupational History   Not on file  Tobacco Use   Smoking status: Former    Current packs/day: 1.00    Average packs/day: 1 pack/day for 10.0 years (10.0 ttl pk-yrs)    Types: Cigarettes   Smokeless tobacco: Never  Substance and Sexual Activity   Alcohol use: Not Currently    Comment: Socially   Drug use: Never   Sexual activity: Yes  Other Topics Concern   Not on file  Social History Narrative   Not on file   Social Determinants of Health   Financial Resource Strain: Not on file  Food Insecurity: Not on file  Transportation Needs: Not on file  Physical Activity: Not on file  Stress: Not on file  Social Connections: Unknown (10/17/2021)   Received from [facility]   Social Network    Social Network: Not on file     Family History: The patient's family history is not on file. ROS:   Please see the history of present illness.    All 14 point review of systems negative except as described per history of present illness  EKGs/Labs/Other Studies Reviewed:          Recent Labs: 01/09/2023: ALT 19; BUN 16; Creatinine, Ser 1.07; Potassium 5.0; Sodium 129  Recent Lipid Panel    Component Value Date/Time   CHOL 202 (H) 01/09/2023 1447   TRIG 106 01/09/2023 1447   HDL 90 01/09/2023 1447   CHOLHDL 2.2 01/09/2023 1447   LDLCALC 94 01/09/2023 1447    Physical Exam:    VS:  BP 134/84 (BP Location: Left Arm, Patient Position: Sitting)   Pulse 84   Ht 5'  6" (1.676 m)   Wt 173 lb 12.8 oz (78.8 kg)   SpO2 91%   BMI 28.05 kg/m     Wt Readings from Last 3 Encounters:  05/06/23 173 lb 12.8 oz (78.8 kg)  01/09/23 177 lb (80.3 kg)  10/03/22 177 lb 3.2 oz (80.4 kg)     GEN:  Well nourished, well developed in no acute distress HEENT: Normal NECK: No JVD; No carotid bruits LYMPHATICS: No lymphadenopathy CARDIAC: RRR, no murmurs, no rubs, no gallops RESPIRATORY:  Clear to auscultation without rales, wheezing or rhonchi  ABDOMEN: Soft, non-tender, non-distended MUSCULOSKELETAL:  No edema; No deformity  SKIN: Warm and dry LOWER EXTREMITIES: no swelling NEUROLOGIC:  Alert and oriented x 3 PSYCHIATRIC:  Normal affect   ASSESSMENT:    1. Elevated coronary artery calcium score   2. Primary hypertension   3. Dyslipidemia    PLAN:    In order of problems listed above:  Essential hypertension seems to be controlled we will continue present management. Dyslipidemia she did try Nexlizet at however had difficulty tolerating it so stopped it.  I will put her back on Zetia.  I did review K PN which show me her LDL of 114 HDL 80 this is from test done just 3 days ago.  Again we will restart Zetia. Elevated calcium score asymptomatic.  Plan is risk factors modifications.  I encouraged her to be more active   Medication Adjustments/Labs and Tests Ordered: Current medicines are reviewed at length with the patient today.  Concerns regarding medicines are outlined above.  No orders of the defined types were placed in this encounter.  Medication changes:  No orders of the defined types were placed in this encounter.   Signed, Georgeanna Lea, MD, [facility] 05/06/2023 10:14 AM    [facility]

## 2023-05-06 NOTE — Patient Instructions (Signed)

## 2023-05-22 DIAGNOSIS — M5416 Radiculopathy, lumbar region: Secondary | ICD-10-CM | POA: Diagnosis not present

## 2023-05-22 DIAGNOSIS — M419 Scoliosis, unspecified: Secondary | ICD-10-CM | POA: Diagnosis not present

## 2023-05-22 DIAGNOSIS — G8929 Other chronic pain: Secondary | ICD-10-CM | POA: Diagnosis not present

## 2023-05-22 DIAGNOSIS — R296 Repeated falls: Secondary | ICD-10-CM | POA: Diagnosis not present

## 2023-07-24 DIAGNOSIS — M1712 Unilateral primary osteoarthritis, left knee: Secondary | ICD-10-CM | POA: Diagnosis not present

## 2023-07-31 DIAGNOSIS — M1712 Unilateral primary osteoarthritis, left knee: Secondary | ICD-10-CM | POA: Diagnosis not present

## 2023-08-05 DIAGNOSIS — M797 Fibromyalgia: Secondary | ICD-10-CM | POA: Diagnosis not present

## 2023-08-05 DIAGNOSIS — R7309 Other abnormal glucose: Secondary | ICD-10-CM | POA: Diagnosis not present

## 2023-08-05 DIAGNOSIS — E039 Hypothyroidism, unspecified: Secondary | ICD-10-CM | POA: Diagnosis not present

## 2023-08-05 DIAGNOSIS — N289 Disorder of kidney and ureter, unspecified: Secondary | ICD-10-CM | POA: Diagnosis not present

## 2023-08-05 DIAGNOSIS — E785 Hyperlipidemia, unspecified: Secondary | ICD-10-CM | POA: Diagnosis not present

## 2023-08-05 DIAGNOSIS — I10 Essential (primary) hypertension: Secondary | ICD-10-CM | POA: Diagnosis not present

## 2023-08-07 DIAGNOSIS — M1712 Unilateral primary osteoarthritis, left knee: Secondary | ICD-10-CM | POA: Diagnosis not present

## 2023-08-07 DIAGNOSIS — M7541 Impingement syndrome of right shoulder: Secondary | ICD-10-CM | POA: Diagnosis not present

## 2023-08-14 DIAGNOSIS — E871 Hypo-osmolality and hyponatremia: Secondary | ICD-10-CM | POA: Diagnosis not present

## 2023-09-16 ENCOUNTER — Other Ambulatory Visit: Payer: Self-pay | Admitting: Cardiology

## 2023-09-25 DIAGNOSIS — Z79899 Other long term (current) drug therapy: Secondary | ICD-10-CM | POA: Diagnosis not present

## 2023-09-25 DIAGNOSIS — M419 Scoliosis, unspecified: Secondary | ICD-10-CM | POA: Diagnosis not present

## 2023-09-25 DIAGNOSIS — M5416 Radiculopathy, lumbar region: Secondary | ICD-10-CM | POA: Diagnosis not present

## 2023-09-25 DIAGNOSIS — Z5181 Encounter for therapeutic drug level monitoring: Secondary | ICD-10-CM | POA: Diagnosis not present

## 2023-09-25 DIAGNOSIS — G8929 Other chronic pain: Secondary | ICD-10-CM | POA: Diagnosis not present

## 2023-09-25 DIAGNOSIS — R296 Repeated falls: Secondary | ICD-10-CM | POA: Diagnosis not present

## 2023-10-24 DIAGNOSIS — F988 Other specified behavioral and emotional disorders with onset usually occurring in childhood and adolescence: Secondary | ICD-10-CM | POA: Insufficient documentation

## 2023-10-24 DIAGNOSIS — D649 Anemia, unspecified: Secondary | ICD-10-CM | POA: Insufficient documentation

## 2023-10-24 DIAGNOSIS — M1990 Unspecified osteoarthritis, unspecified site: Secondary | ICD-10-CM | POA: Insufficient documentation

## 2023-10-24 DIAGNOSIS — E781 Pure hyperglyceridemia: Secondary | ICD-10-CM | POA: Insufficient documentation

## 2023-10-24 DIAGNOSIS — G4700 Insomnia, unspecified: Secondary | ICD-10-CM | POA: Insufficient documentation

## 2023-10-24 DIAGNOSIS — A4902 Methicillin resistant Staphylococcus aureus infection, unspecified site: Secondary | ICD-10-CM | POA: Insufficient documentation

## 2023-10-24 DIAGNOSIS — J189 Pneumonia, unspecified organism: Secondary | ICD-10-CM | POA: Insufficient documentation

## 2023-10-24 DIAGNOSIS — R002 Palpitations: Secondary | ICD-10-CM | POA: Insufficient documentation

## 2023-10-24 DIAGNOSIS — N289 Disorder of kidney and ureter, unspecified: Secondary | ICD-10-CM | POA: Insufficient documentation

## 2023-10-24 DIAGNOSIS — R52 Pain, unspecified: Secondary | ICD-10-CM | POA: Insufficient documentation

## 2023-10-24 DIAGNOSIS — R112 Nausea with vomiting, unspecified: Secondary | ICD-10-CM | POA: Insufficient documentation

## 2023-10-24 DIAGNOSIS — I10 Essential (primary) hypertension: Secondary | ICD-10-CM | POA: Insufficient documentation

## 2023-10-24 DIAGNOSIS — K219 Gastro-esophageal reflux disease without esophagitis: Secondary | ICD-10-CM | POA: Insufficient documentation

## 2023-10-30 ENCOUNTER — Encounter: Payer: Self-pay | Admitting: Cardiology

## 2023-10-30 ENCOUNTER — Ambulatory Visit: Attending: Cardiology | Admitting: Cardiology

## 2023-10-30 VITALS — BP 148/84 | HR 88 | Ht 66.0 in | Wt 179.0 lb

## 2023-10-30 DIAGNOSIS — I10 Essential (primary) hypertension: Secondary | ICD-10-CM

## 2023-10-30 DIAGNOSIS — R931 Abnormal findings on diagnostic imaging of heart and coronary circulation: Secondary | ICD-10-CM

## 2023-10-30 DIAGNOSIS — I209 Angina pectoris, unspecified: Secondary | ICD-10-CM

## 2023-10-30 NOTE — Progress Notes (Signed)
 Cardiology Office Note:    Date:  10/30/2023   ID:  Stephanie Cobb [birth year], MRN [id number]  PCP:  Olga Berthold, NP  Cardiologist:  Ralene Burger, MD    Referring MD: Olga Berthold, NP   No chief complaint on file.   History of Present Illness:    Stephanie Cobb is a 63 y.o. female past medical history significant for atypical chest pain, dyslipidemia, fibromyalgia, essential hypertension, she did have a stress test done which was -2023 calcium score show only 14 which was 66%.  Comes today to my office follow-up overall majority of time is doing well but she does complain of having some episodes of sky high blood pressure.  That is many times associated with chest pain.  She does have chronic back problem she still able to walk but walking somewhat difficult because of back issues.  Past Medical History:  Diagnosis Date   ADD (attention deficit disorder)    Anemia    Angina pectoris (HCC) 09/21/2021   Anxiety    Arthritis    OA / PAIN BOTH HIPS   Capsulitis of metatarsophalangeal (MTP) joint of right foot 11/21/2015   Depression    Dyslipidemia 09/21/2021   Elevated coronary artery calcium score 03/06/2022   Expected blood loss anemia 02/24/2014   Female stress incontinence 02/11/2023   Fibromyalgia    Full thickness rotator cuff tear 05/20/2021   GERD (gastroesophageal reflux disease)    Heart palpitations    NUCLEAR STRESS TEST 12/14/13 AT [facility]- NO ISCHEMIA, EF 74%   Hypercholesterolemia 03/18/2018   Hypertension    Hypertensive disorder 03/18/2018   Hypertriglyceridemia    Hypothyroidism    Insomnia    Kidney insufficiency    PT STATES BUN SLIGHTLY ELEVATED - HER DOCTORS ARE WATCHING   Lumbar radiculopathy 04/01/2018   Menopausal symptom 07/25/2021   Metatarsalgia of right foot 11/21/2015   Migraine 03/18/2018   MRSA (methicillin resistant Staphylococcus aureus)    hx of 2012- left upper leg - treated with Vancomycin     Multifocal pneumonia  05/26/2019   Olecranon bursitis 02/26/2022   Osteoarthritis of carpometacarpal (CMC) joint of thumb 02/26/2022   Osteoarthritis of left knee 01/25/2021   Overweight (BMI 25.0-29.9) 01/13/2014   Pain    CHRONIC NECK PAIN - DDD CERVICOTHORACIC; HX OF CERVICA FUSION - PT STATES 2 AREAS HAVE NOT FUSED AND SHE WILL NEED FURTHER SURGERY IN THE FUTURE   Pain in joint of left knee 11/25/2020   Pain in thumb joint with movement of left hand 02/26/2022   Personal history of COVID-19 05/05/2019   Pneumonia    ABOUT 6 YRS AGO   Pneumonia due to COVID-19 virus 05/26/2019   PONV (postoperative nausea and vomiting)    Primary osteoarthritis of both hips 10/29/2013   Recurrent falls 09/25/2022   S/P right THA, 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   Scoliosis     Past Surgical History:  Procedure Laterality Date   ABDOMINAL HYSTERECTOMY     CERVICAL FUSION  2011   TOTAL HIP ARTHROPLASTY Left 01/12/2014   Procedure: LEFT TOTAL HIP ARTHROPLASTY ANTERIOR APPROACH;  Surgeon: Bevin Bucks, MD;  Location: [facility];  Service: Orthopedics;  Laterality: Left;   TOTAL HIP ARTHROPLASTY Right 02/23/2014   Procedure: RIGHT TOTAL HIP ARTHROPLASTY ANTERIOR APPROACH;  Surgeon: Bevin Bucks, MD;  Location: [facility];  Service: Orthopedics;  Laterality: Right;   TUBAL LIGATION      Current Medications: Current Meds  Medication Sig  amLODipine (NORVASC) 5 MG tablet Take 10 mg by mouth in the morning and at bedtime.   aspirin  EC 81 MG tablet Take 1 tablet (81 mg total) by mouth daily. Swallow whole.   buPROPion  (WELLBUTRIN  XL) 300 MG 24 hr tablet Take 300 mg by mouth every morning.   carisoprodol  (SOMA ) 350 MG tablet Take 350 mg by mouth at bedtime.    cloNIDine (CATAPRES) 0.1 MG tablet Take 0.1 mg by mouth as needed (BP 170or >).   cyclobenzaprine (FLEXERIL) 5 MG tablet Take 5 mg by mouth at bedtime.   diazepam  (VALIUM ) 5 MG tablet Take 5 mg by mouth every 8 (eight) hours as needed for muscle spasms (headaches).   DULoxetine  (CYMBALTA )  30 MG capsule Take 30 mg by mouth daily.   DULoxetine  (CYMBALTA ) 60 MG capsule Take 90 mg by mouth every morning.   estradiol  (ESTRACE ) 2 MG tablet Take 1 mg by mouth daily. Taking 1/2 tablet daily   ezetimibe  (ZETIA ) 10 MG tablet Take 10 mg by mouth daily.   gabapentin  (NEURONTIN ) 300 MG capsule Take 300 mg by mouth every 4 (four) hours as needed (Nerve pain).   levothyroxine  (SYNTHROID ) 75 MCG tablet Take 75 mcg by mouth daily.   linaCLOtide (LINZESS PO) Take 1 tablet by mouth as needed (constipation).   Magnesium  250 MG TABS Take 1 tablet by mouth daily.   montelukast  (SINGULAIR ) 10 MG tablet Take 10 mg by mouth every morning.    Multiple Vitamins-Minerals (CENTRUM WOMEN PO) Take 1 tablet by mouth daily.   nabumetone (RELAFEN) 500 MG tablet Take 500 mg by mouth 2 (two) times daily.   pantoprazole  (PROTONIX ) 40 MG tablet Take 40 mg by mouth at bedtime.    ramipril  (ALTACE ) 5 MG capsule Take 5-10 mg by mouth 2 (two) times daily.   rizatriptan  (MAXALT -MLT) 10 MG disintegrating tablet Take 10 mg by mouth every 2 (two) hours as needed for migraine. May repeat in 2 hours if needed     Allergies:   Mupirocin, Nexlizet  [bempedoic acid-ezetimibe ], Other, Oxycodone , and Vicodin [hydrocodone -acetaminophen ]   Social History   Socioeconomic History   Marital status: Married    Spouse name: Not on file   Number of children: Not on file   Years of education: Not on file   Highest education level: Not on file  Occupational History   Not on file  Tobacco Use   Smoking status: Former    Current packs/day: 1.00    Average packs/day: 1 pack/day for 10.0 years (10.0 ttl pk-yrs)    Types: Cigarettes   Smokeless tobacco: Never  Substance and Sexual Activity   Alcohol use: Not Currently    Comment: Socially   Drug use: Never   Sexual activity: Yes  Other Topics Concern   Not on file  Social History Narrative   Not on file   Social Drivers of Health   Financial Resource Strain: Not on file   Food Insecurity: Not on file  Transportation Needs: Not on file  Physical Activity: Not on file  Stress: Not on file  Social Connections: Unknown (10/17/2021)   [facility]   Social Network    Social Network: Not on file     Family History: The patient's family history includes Heart attack in her father and mother; Hypertension in her brother, father, and mother; Stroke in her brother. There is no history of Diabetes or Cancer. ROS:   Please see the history of present illness.  All 14 point review of systems negative except as described per history of present illness  EKGs/Labs/Other Studies Reviewed:         Recent Labs: 01/09/2023: ALT 19; BUN 16; Creatinine, Ser 1.07; Potassium 5.0; Sodium 129  Recent Lipid Panel    Component Value Date/Time   CHOL 202 (H) 01/09/2023 1447   TRIG 106 01/09/2023 1447   HDL 90 01/09/2023 1447   CHOLHDL 2.2 01/09/2023 1447   LDLCALC 94 01/09/2023 1447    Physical Exam:    VS:  BP (!) 148/84   Pulse 88   Ht 5\' 6"  (1.676 m)   Wt 179 lb (81.2 kg)   SpO2 96%   BMI 28.89 kg/m     Wt Readings from Last 3 Encounters:  10/30/23 179 lb (81.2 kg)  05/06/23 173 lb 12.8 oz (78.8 kg)  01/09/23 177 lb (80.3 kg)     GEN:  Well nourished, well developed in no acute distress HEENT: Normal NECK: No JVD; No carotid bruits LYMPHATICS: No lymphadenopathy CARDIAC: RRR, no murmurs, no rubs, no gallops RESPIRATORY:  Clear to auscultation without rales, wheezing or rhonchi  ABDOMEN: Soft, non-tender, non-distended MUSCULOSKELETAL:  No edema; No deformity  SKIN: Warm and dry LOWER EXTREMITIES: no swelling NEUROLOGIC:  Alert and oriented x 3 PSYCHIATRIC:  Normal affect   ASSESSMENT:    1. Angina pectoris (HCC)   2. Elevated coronary artery calcium score   3. Primary hypertension    PLAN:    In order of problems listed above:  Chest pain might be worried she does have elevated calcium score.  Will repeat  Lexiscan  to make sure she does not have any inducible ischemia in the meantime we will concentrate on managing her blood pressure better. Essential hypertension Chem-7 will be done based on results of Chem-7 will decide which way to go if potassium is low then we will add Aldactone if potassium is high then we consider hydrochlorothiazide. Dyslipidemia show me herI did review K PN which LDL of 86 HDL 77 need to be better controlled will get a stress test and decide which way to go   Medication Adjustments/Labs and Tests Ordered: Current medicines are reviewed at length with the patient today.  Concerns regarding medicines are outlined above.  No orders of the defined types were placed in this encounter.  Medication changes: No orders of the defined types were placed in this encounter.   Signed, Manfred Seed, MD, [facility] 10/30/2023 2:29 PM    [facility]

## 2023-10-30 NOTE — Patient Instructions (Signed)
 Medication Instructions:  Your physician recommends that you continue on your current medications as directed. Please refer to the Current Medication list given to you today.  *If you need a refill on your cardiac medications before your next appointment, please call your pharmacy*  Lab Work: Your physician recommends that you return for lab work in:   Labs today: BMP  If you have labs (blood work) drawn today and your tests are completely normal, you will receive your results only by: MyChart Message (if you have MyChart) OR A paper copy in the mail If you have any lab test that is abnormal or we need to change your treatment, we will call you to review the results.  Testing/Procedures:   [facility] [address] [place], [place] [address] Phone:  [phone]    Please arrive 15 minutes prior to your appointment time for registration and insurance purposes.  The test will take approximately 3 to 4 hours to complete; you may bring reading material.  If someone comes with you to your appointment, they will need to remain in the main lobby due to limited space in the testing area. **If you are pregnant or breastfeeding, please notify the nuclear lab prior to your appointment**  How to prepare for your Myocardial Perfusion Test: Do not eat or drink 3 hours prior to your test, except you may have water. Do not consume products containing caffeine (regular or decaffeinated) 12 hours prior to your test. (ex: coffee, chocolate, sodas, tea). Do bring a list of your current medications with you.  If not listed below, you may take your medications as normal. Do wear comfortable clothes (no dresses or overalls) and walking shoes, tennis shoes preferred (No heels or open toe shoes are allowed). Do NOT wear cologne, perfume, aftershave, or lotions (deodorant is allowed). If these instructions are not followed, your test will have to be rescheduled.  Please report  to [address] for your test.  If you have questions or concerns about your appointment, you can call the [facility] Lab at [phone].  If you cannot keep your appointment, please provide 24 hours notification to the Nuclear Lab, to avoid a possible $50 charge to your account.   Follow-Up: At [facility], you and your health needs are our priority.  As part of our continuing mission to provide you with exceptional heart care, our providers are all part of one team.  This team includes your primary Cardiologist (physician) and Advanced Practice Providers or APPs (Physician Assistants and Nurse Practitioners) who all work together to provide you with the care you need, when you need it.  Your next appointment:   3 month(s)  Provider:   Ralene Burger, MD    We recommend signing up for the patient portal called "MyChart".  Sign up information is provided on this After Visit Summary.  MyChart is used to connect with patients for Virtual Visits (Telemedicine).  Patients are able to view lab/test results, encounter notes, upcoming appointments, etc.  Non-urgent messages can be sent to your provider as well.   To learn more about what you can do with MyChart, go to [email or web address].   Other Instructions None

## 2023-10-30 NOTE — Addendum Note (Signed)
 Addended by: Aurelio Leer I on: 10/30/2023 02:56 PM   Modules accepted: Orders

## 2023-10-30 NOTE — Addendum Note (Signed)
 Addended by: Aurelio Leer I on: 10/30/2023 02:40 PM   Modules accepted: Orders

## 2023-10-31 LAB — BASIC METABOLIC PANEL WITH GFR
BUN/Creatinine Ratio: 14 (ref 12–28)
BUN: 17 mg/dL (ref 8–27)
CO2: 19 mmol/L — ABNORMAL LOW (ref 20–29)
Calcium: 9.1 mg/dL (ref 8.7–10.3)
Chloride: 93 mmol/L — ABNORMAL LOW (ref 96–106)
Creatinine, Ser: 1.21 mg/dL — ABNORMAL HIGH (ref 0.57–1.00)
Glucose: 126 mg/dL — ABNORMAL HIGH (ref 70–99)
Potassium: 4.9 mmol/L (ref 3.5–5.2)
Sodium: 130 mmol/L — ABNORMAL LOW (ref 134–144)
eGFR: 50 mL/min/{1.73_m2} — ABNORMAL LOW (ref >59)

## 2023-11-01 ENCOUNTER — Telehealth: Payer: Self-pay | Admitting: Cardiology

## 2023-11-01 ENCOUNTER — Ambulatory Visit: Payer: Self-pay | Admitting: Cardiology

## 2023-11-01 MED ORDER — EZETIMIBE 10 MG PO TABS: 10.0000 mg | ORAL_TABLET | Freq: Every day | ORAL | 3 refills | Status: DC

## 2023-11-01 NOTE — Telephone Encounter (Signed)
 Pt's medication was sent to pt's pharmacy as requested. Confirmation received.

## 2023-11-01 NOTE — Telephone Encounter (Signed)
*  STAT* If patient is at the pharmacy, call can be transferred to refill team.   1. Which medications need to be refilled? (please list name of each medication and dose if known)   ezetimibe  (ZETIA ) 10 MG tablet     4. Which pharmacy/location (including street and city if local pharmacy) is medication to be sent to?  [id number] [place]ana Kindler, [place] - [address] AT [facility] [address] Phone: [phone]  Fax: [phone]       5. Do they need a 30 day or 90 day supply? 90

## 2023-11-05 ENCOUNTER — Telehealth (HOSPITAL_COMMUNITY): Payer: Self-pay | Admitting: *Deleted

## 2023-11-05 DIAGNOSIS — K5904 Chronic idiopathic constipation: Secondary | ICD-10-CM | POA: Diagnosis not present

## 2023-11-05 DIAGNOSIS — N289 Disorder of kidney and ureter, unspecified: Secondary | ICD-10-CM | POA: Diagnosis not present

## 2023-11-05 DIAGNOSIS — I10 Essential (primary) hypertension: Secondary | ICD-10-CM | POA: Diagnosis not present

## 2023-11-05 DIAGNOSIS — G44229 Chronic tension-type headache, not intractable: Secondary | ICD-10-CM | POA: Diagnosis not present

## 2023-11-05 DIAGNOSIS — E039 Hypothyroidism, unspecified: Secondary | ICD-10-CM | POA: Diagnosis not present

## 2023-11-05 DIAGNOSIS — I700 Atherosclerosis of aorta: Secondary | ICD-10-CM | POA: Diagnosis not present

## 2023-11-05 DIAGNOSIS — M797 Fibromyalgia: Secondary | ICD-10-CM | POA: Diagnosis not present

## 2023-11-05 DIAGNOSIS — E785 Hyperlipidemia, unspecified: Secondary | ICD-10-CM | POA: Diagnosis not present

## 2023-11-05 DIAGNOSIS — M159 Polyosteoarthritis, unspecified: Secondary | ICD-10-CM | POA: Diagnosis not present

## 2023-11-05 NOTE — Telephone Encounter (Signed)
 Left message on voicemail per DPR in reference to upcoming appointment scheduled on 11/12/23 with detailed instructions given per Myocardial Perfusion Study Information Sheet for the test. LM to arrive 15 minutes early, and that it is imperative to arrive on time for appointment to keep from having the test rescheduled. If you need to cancel or reschedule your appointment, please call the office within 24 hours of your appointment. Failure to do so may result in a cancellation of your appointment, and a $50 no show fee. Phone number given for call back for any questions. Argentina Bees, RN

## 2023-11-06 LAB — LAB REPORT - SCANNED: EGFR: 50

## 2023-11-06 NOTE — Addendum Note (Signed)
 Addended by: Ralene Burger on: 11/06/2023 08:13 AM   Modules accepted: Orders

## 2023-11-12 ENCOUNTER — Ambulatory Visit: Attending: Cardiology

## 2023-11-12 DIAGNOSIS — I209 Angina pectoris, unspecified: Secondary | ICD-10-CM

## 2023-11-12 DIAGNOSIS — I10 Essential (primary) hypertension: Secondary | ICD-10-CM | POA: Diagnosis not present

## 2023-11-12 DIAGNOSIS — R931 Abnormal findings on diagnostic imaging of heart and coronary circulation: Secondary | ICD-10-CM

## 2023-11-12 LAB — MYOCARDIAL PERFUSION IMAGING
LV dias vol: 65 mL (ref 46–106)
LV sys vol: 19 mL
Nuc Stress EF: 71 %
Peak HR: 88 {beats}/min
Rest HR: 74 {beats}/min
Rest Nuclear Isotope Dose: 10.8 mCi
SDS: 2
SRS: 1
SSS: 3
Stress Nuclear Isotope Dose: 31.2 mCi
TID: 0.95

## 2023-11-12 MED ORDER — REGADENOSON 0.4 MG/5ML IV SOLN
0.4000 mg | Freq: Once | INTRAVENOUS | Status: AC
  Administered 2023-11-12: 0.4 mg via INTRAVENOUS

## 2023-11-12 MED ORDER — TECHNETIUM TC 99M TETROFOSMIN IV KIT
10.8000 | PACK | Freq: Once | INTRAVENOUS | Status: AC | PRN
  Administered 2023-11-12: 10.8 via INTRAVENOUS

## 2023-11-12 MED ORDER — TECHNETIUM TC 99M TETROFOSMIN IV KIT
31.2000 | PACK | Freq: Once | INTRAVENOUS | Status: AC | PRN
  Administered 2023-11-12: 31.2 via INTRAVENOUS

## 2023-11-19 DIAGNOSIS — E875 Hyperkalemia: Secondary | ICD-10-CM | POA: Diagnosis not present

## 2023-11-19 LAB — COMPREHENSIVE METABOLIC PANEL WITH GFR: EGFR: 55

## 2023-11-20 ENCOUNTER — Telehealth: Payer: Self-pay

## 2023-11-20 NOTE — Telephone Encounter (Signed)
 Left message on My Chart with Stress test results per Dr. Vanetta Shawl note. Routed to PCP.

## 2023-11-26 ENCOUNTER — Telehealth: Payer: Self-pay

## 2023-11-26 NOTE — Telephone Encounter (Signed)
 Spoke with pt regarding blood work she brought in for Dr. Bernie to review. Advised to check blood pressure 2 hours after her blood pressure medications and report readings next week. She is not on any fluid medications at this time. Will send readings to Dr. Bernie to review. Pt agreed and verbalized understanding.

## 2024-01-22 DIAGNOSIS — M5416 Radiculopathy, lumbar region: Secondary | ICD-10-CM | POA: Diagnosis not present

## 2024-01-29 ENCOUNTER — Encounter: Payer: Self-pay | Admitting: Cardiology

## 2024-01-29 ENCOUNTER — Ambulatory Visit: Attending: Cardiology | Admitting: Cardiology

## 2024-01-29 VITALS — BP 132/84 | HR 83 | Ht 66.0 in | Wt 180.6 lb

## 2024-01-29 DIAGNOSIS — I209 Angina pectoris, unspecified: Secondary | ICD-10-CM

## 2024-01-29 DIAGNOSIS — R931 Abnormal findings on diagnostic imaging of heart and coronary circulation: Secondary | ICD-10-CM

## 2024-01-29 DIAGNOSIS — I10 Essential (primary) hypertension: Secondary | ICD-10-CM | POA: Diagnosis not present

## 2024-01-29 DIAGNOSIS — E785 Hyperlipidemia, unspecified: Secondary | ICD-10-CM

## 2024-01-29 MED ORDER — METOPROLOL TARTRATE 25 MG PO TABS: 25.0000 mg | ORAL_TABLET | Freq: Two times a day (BID) | ORAL | 3 refills | Status: DC

## 2024-01-29 NOTE — Patient Instructions (Addendum)
 Medication Instructions:   START: Metoprolol  Tartrate 25mg  1 tablet in the morning   Lab Work: None Ordered If you have labs (blood work) drawn today and your tests are completely normal, you will receive your results only by: MyChart Message (if you have MyChart) OR A paper copy in the mail If you have any lab test that is abnormal or we need to change your treatment, we will call you to review the results.   Testing/Procedures: None Ordered   Follow-Up: At [facility], you and your health needs are our priority.  As part of our continuing mission to provide you with exceptional heart care, we have created designated Provider Care Teams.  These Care Teams include your primary Cardiologist (physician) and Advanced Practice Providers (APPs -  Physician Assistants and Nurse Practitioners) who all work together to provide you with the care you need, when you need it.  We recommend signing up for the patient portal called MyChart.  Sign up information is provided on this After Visit Summary.  MyChart is used to connect with patients for Virtual Visits (Telemedicine).  Patients are able to view lab/test results, encounter notes, upcoming appointments, etc.  Non-urgent messages can be sent to your provider as well.   To learn more about what you can do with MyChart, go to [email or web address].    Your next appointment:   4 month(s)  The format for your next appointment:   In Person  Provider:   Lamar Fitch, MD    Other Instructions NA

## 2024-01-29 NOTE — Progress Notes (Signed)
 Cardiology Office Note:    Date:  01/29/2024   ID:  Stephanie Cobb, Stephanie Cobb 23-Apr-1961, MRN [id number]  PCP:  Erick Greig LABOR, NP  Cardiologist:  Lamar Fitch, MD    Referring MD: Erick Greig LABOR, NP   No chief complaint on file.   History of Present Illness:    Stephanie Cobb is a 63 y.o. female past medical history significant for atypical chest pain, recent stress test negative, elevated calcium score of 14 which is 66 percentile, dyslipidemia, essential hypertension, fibromyalgia.  Comes today 2 months for regular follow-up.  Still described to have spikes blood pressure when she is at work she also describes a very stressful situation at work with a lot of stressors.  Denies having much chest pain.  Recent stress test reviewed negative.  Past Medical History:  Diagnosis Date   ADD (attention deficit disorder)    Anemia    Angina pectoris (HCC) 09/21/2021   Anxiety    Arthritis    OA / PAIN BOTH HIPS   Capsulitis of metatarsophalangeal (MTP) joint of right foot 11/21/2015   Depression    Dyslipidemia 09/21/2021   Elevated coronary artery calcium score 03/06/2022   Expected blood loss anemia 02/24/2014   Female stress incontinence 02/11/2023   Fibromyalgia    Full thickness rotator cuff tear 05/20/2021   GERD (gastroesophageal reflux disease)    Heart palpitations    NUCLEAR STRESS TEST 12/14/13 AT [facility]- NO ISCHEMIA, EF 74%   Hypercholesterolemia 03/18/2018   Hypertension    Hypertensive disorder 03/18/2018   Hypertriglyceridemia    Hypothyroidism    Insomnia    Kidney insufficiency    PT STATES BUN SLIGHTLY ELEVATED - HER DOCTORS ARE WATCHING   Lumbar radiculopathy 04/01/2018   Menopausal symptom 07/25/2021   Metatarsalgia of right foot 11/21/2015   Migraine 03/18/2018   MRSA (methicillin resistant Staphylococcus aureus)    hx of 2012- left upper leg - treated with Vancomycin     Multifocal pneumonia 05/26/2019   Olecranon bursitis 02/26/2022    Osteoarthritis of carpometacarpal (CMC) joint of thumb 02/26/2022   Osteoarthritis of left knee 01/25/2021   Overweight (BMI 25.0-29.9) 01/13/2014   Pain    CHRONIC NECK PAIN - DDD CERVICOTHORACIC; HX OF CERVICA FUSION - PT STATES 2 AREAS HAVE NOT FUSED AND SHE WILL NEED FURTHER SURGERY IN THE FUTURE   Pain in joint of left knee 11/25/2020   Pain in thumb joint with movement of left hand 02/26/2022   Personal history of COVID-19 05/05/2019   Pneumonia    ABOUT 6 YRS AGO   Pneumonia due to COVID-19 virus 05/26/2019   PONV (postoperative nausea and vomiting)    Primary osteoarthritis of both hips 10/29/2013   Recurrent falls 09/25/2022   S/P right THA, 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   Scoliosis     Past Surgical History:  Procedure Laterality Date   ABDOMINAL HYSTERECTOMY     CERVICAL FUSION  2011   TOTAL HIP ARTHROPLASTY Left 01/12/2014   Procedure: LEFT TOTAL HIP ARTHROPLASTY ANTERIOR APPROACH;  Surgeon: Donnice JONETTA Car, MD;  Location: [facility];  Service: Orthopedics;  Laterality: Left;   TOTAL HIP ARTHROPLASTY Right 02/23/2014   Procedure: RIGHT TOTAL HIP ARTHROPLASTY ANTERIOR APPROACH;  Surgeon: Donnice JONETTA Car, MD;  Location: [facility];  Service: Orthopedics;  Laterality: Right;   TUBAL LIGATION      Current Medications: Current Meds  Medication Sig   amLODipine (NORVASC) 5 MG tablet Take 10 mg by mouth in the morning  and at bedtime.   aspirin  EC 81 MG tablet Take 1 tablet (81 mg total) by mouth daily. Swallow whole.   buPROPion  (WELLBUTRIN  XL) 300 MG 24 hr tablet Take 300 mg by mouth every morning.   carisoprodol  (SOMA ) 350 MG tablet Take 350 mg by mouth at bedtime.    cloNIDine (CATAPRES) 0.1 MG tablet Take 0.1 mg by mouth as needed (BP 170or >).   cyclobenzaprine (FLEXERIL) 5 MG tablet Take 5 mg by mouth at bedtime.   diazepam  (VALIUM ) 5 MG tablet Take 5 mg by mouth every 8 (eight) hours as needed for muscle spasms (headaches).   DULoxetine  (CYMBALTA ) 30 MG capsule Take 30 mg by mouth daily.    DULoxetine  (CYMBALTA ) 60 MG capsule Take 90 mg by mouth every morning.   estradiol  (ESTRACE ) 2 MG tablet Take 1 mg by mouth daily. Taking 1/2 tablet daily   ezetimibe  (ZETIA ) 10 MG tablet Take 1 tablet (10 mg total) by mouth daily.   gabapentin  (NEURONTIN ) 300 MG capsule Take 300 mg by mouth every 4 (four) hours as needed (Nerve pain).   levothyroxine  (SYNTHROID ) 75 MCG tablet Take 75 mcg by mouth daily.   linaCLOtide (LINZESS PO) Take 1 tablet by mouth as needed (constipation).   Magnesium  250 MG TABS Take 1 tablet by mouth daily.   montelukast  (SINGULAIR ) 10 MG tablet Take 10 mg by mouth every morning.    Multiple Vitamins-Minerals (CENTRUM WOMEN PO) Take 1 tablet by mouth daily.   nabumetone (RELAFEN) 500 MG tablet Take 500 mg by mouth 2 (two) times daily.   pantoprazole  (PROTONIX ) 40 MG tablet Take 40 mg by mouth at bedtime.    ramipril  (ALTACE ) 5 MG capsule Take 5-10 mg by mouth 2 (two) times daily.   rizatriptan  (MAXALT -MLT) 10 MG disintegrating tablet Take 10 mg by mouth every 2 (two) hours as needed for migraine. May repeat in 2 hours if needed     Allergies:   Mupirocin, Nexlizet  [bempedoic acid-ezetimibe ], Other, Oxycodone , and Vicodin [hydrocodone -acetaminophen ]   Social History   Socioeconomic History   Marital status: Married    Spouse name: Not on file   Number of children: Not on file   Years of education: Not on file   Highest education level: Not on file  Occupational History   Not on file  Tobacco Use   Smoking status: Former    Current packs/day: 1.00    Average packs/day: 1 pack/day for 10.0 years (10.0 ttl pk-yrs)    Types: Cigarettes   Smokeless tobacco: Never  Substance and Sexual Activity   Alcohol use: Not Currently    Comment: Socially   Drug use: Never   Sexual activity: Yes  Other Topics Concern   Not on file  Social History Narrative   Not on file   Social Drivers of Health   Financial Resource Strain: Not on file  Food Insecurity: Not on  file  Transportation Needs: Not on file  Physical Activity: Not on file  Stress: Not on file  Social Connections: Unknown (10/17/2021)   Received from [facility]   Social Network    Social Network: Not on file     Family History: The patient's family history includes Heart attack in her father and mother; Hypertension in her brother, father, and mother; Stroke in her brother. There is no history of Diabetes or Cancer. ROS:   Please see the history of present illness.    All 14 point review of systems negative except as described per  history of present illness  EKGs/Labs/Other Studies Reviewed:    EKG Interpretation Date/Time:  Wednesday January 29 2024 14:16:31 EDT Ventricular Rate:  83 PR Interval:  190 QRS Duration:  84 QT Interval:  354 QTC Calculation: 415 R Axis:   -40  Text Interpretation: Normal sinus rhythm Left axis deviation Abnormal ECG When compared with ECG of 09-Jan-2023 14:13, No significant change was found Confirmed by Bernie [place] [phone]) on 01/29/2024 2:20:50 PM    Recent Labs: 10/30/2023: BUN 17; Creatinine, Ser 1.21; Potassium 4.9; Sodium 130  Recent Lipid Panel    Component Value Date/Time   CHOL 202 (H) 01/09/2023 1447   TRIG 106 01/09/2023 1447   HDL 90 01/09/2023 1447   CHOLHDL 2.2 01/09/2023 1447   LDLCALC 94 01/09/2023 1447    Physical Exam:    VS:  BP 132/84   Pulse 83   Ht 5' 6 (1.676 m)   Wt 180 lb 9.6 oz (81.9 kg)   SpO2 97%   BMI 29.15 kg/m     Wt Readings from Last 3 Encounters:  01/29/24 180 lb 9.6 oz (81.9 kg)  11/12/23 179 lb (81.2 kg)  10/30/23 179 lb (81.2 kg)     GEN:  Well nourished, well developed in no acute distress HEENT: Normal NECK: No JVD; No carotid bruits LYMPHATICS: No lymphadenopathy CARDIAC: RRR, no murmurs, no rubs, no gallops RESPIRATORY:  Clear to auscultation without rales, wheezing or rhonchi  ABDOMEN: Soft, non-tender, non-distended MUSCULOSKELETAL:  No edema; No deformity  SKIN: Warm and  dry LOWER EXTREMITIES: no swelling NEUROLOGIC:  Alert and oriented x 3 PSYCHIATRIC:  Normal affect   ASSESSMENT:    1. Elevated coronary artery calcium score   2. Primary hypertension   3. Dyslipidemia   4. Angina pectoris (HCC)    PLAN:    In order of problems listed above:  Chest pain atypical negative stress test we will continue monitoring, calcium score mildly elevated risk factors modification of which include antiplatelets therapy that she is on as well as cholesterol reduction. Blood pressure which seems to be difficult to control clearly there are some stressors I think she may benefit from beta-blocker we will start her on small dose of beta-blocker only 25 mg metoprolol  titrate to see if that helps. Dyslipidemia, she is taking Zetia , we did try statin she was unable to tolerate it, I did review last fasting lipid profile which show HDL 84 which is very good LDL 95 could be a little better she understand this but she prefers to continue present management. Elevated calcium score noted   Medication Adjustments/Labs and Tests Ordered: Current medicines are reviewed at length with the patient today.  Concerns regarding medicines are outlined above.  Orders Placed This Encounter  Procedures   EKG 12-Lead   EKG 12-Lead   Medication changes: No orders of the defined types were placed in this encounter.   Signed, [place] DOROTHA Bernie, MD, [facility] 01/29/2024 2:34 PM    [facility]

## 2024-02-14 DIAGNOSIS — E785 Hyperlipidemia, unspecified: Secondary | ICD-10-CM | POA: Diagnosis not present

## 2024-02-14 DIAGNOSIS — M159 Polyosteoarthritis, unspecified: Secondary | ICD-10-CM | POA: Diagnosis not present

## 2024-02-14 DIAGNOSIS — E039 Hypothyroidism, unspecified: Secondary | ICD-10-CM | POA: Diagnosis not present

## 2024-02-14 DIAGNOSIS — M797 Fibromyalgia: Secondary | ICD-10-CM | POA: Diagnosis not present

## 2024-02-14 DIAGNOSIS — E559 Vitamin D deficiency, unspecified: Secondary | ICD-10-CM | POA: Diagnosis not present

## 2024-02-14 DIAGNOSIS — K5904 Chronic idiopathic constipation: Secondary | ICD-10-CM | POA: Diagnosis not present

## 2024-02-14 DIAGNOSIS — I10 Essential (primary) hypertension: Secondary | ICD-10-CM | POA: Diagnosis not present

## 2024-02-14 DIAGNOSIS — E538 Deficiency of other specified B group vitamins: Secondary | ICD-10-CM | POA: Diagnosis not present

## 2024-02-14 DIAGNOSIS — N289 Disorder of kidney and ureter, unspecified: Secondary | ICD-10-CM | POA: Diagnosis not present

## 2024-02-28 ENCOUNTER — Encounter: Payer: Self-pay | Admitting: Internal Medicine

## 2024-03-09 ENCOUNTER — Telehealth: Payer: Self-pay | Admitting: Cardiology

## 2024-03-09 MED ORDER — AMLODIPINE BESYLATE 5 MG PO TABS: 10.0000 mg | ORAL_TABLET | Freq: Two times a day (BID) | ORAL | 3 refills | Status: AC

## 2024-03-09 NOTE — Telephone Encounter (Signed)
*  STAT* If patient is at the pharmacy, call can be transferred to refill team.   1. Which medications need to be refilled? (please list name of each medication and dose if known)   amLODipine (NORVASC) 5 MG tablet   2. Would you like to learn more about the convenience, safety, & potential cost savings by using the [facility]?   3. Are you open to using the [facility] (Type [facility]. ).  4. Which pharmacy/location (including street and city if local pharmacy) is medication to be sent to?  [id number] - [place], [place] - [address]   5. Do they need a 30 day or 90 day supply? 90 day  Patient stated she only has 4 tablets left.

## 2024-03-09 NOTE — Telephone Encounter (Signed)
 RX sent in

## 2024-03-09 NOTE — Telephone Encounter (Signed)
 Pt c/o swelling/edema: STAT if pt has developed SOB within 24 hours  If swelling, where is the swelling located?   Ankles - mostly right foot  How much weight have you gained and in what time span?   No  Have you gained 2 pounds in a day or 5 pounds in a week?   No  Do you have a log of your daily weights (if so, list)?   No  Are you currently taking a fluid pill?   No  Are you currently SOB?   Have you traveled recently in a car or plane for an extended period of time?  No  Patient noted she sits 8 hours a day unless she gets up to go to the restroom.  Patient stated when she first started her metoprolol  tartrate (LOPRESSOR ) 25 MG tablet is was helping but she is now getting swelling again and feeling more tired.

## 2024-03-09 NOTE — Telephone Encounter (Signed)
 Pt reported feeling better since starting Metoprolol . She stated that her ankles were swelling a little and she was a little tired. Encouraged her to stay well hydrated, elevate her feet and legs as she is able and let us  know if she has any problems or changes in her condition.

## 2024-03-18 ENCOUNTER — Telehealth: Payer: Self-pay | Admitting: Pharmacy Technician

## 2024-03-18 ENCOUNTER — Other Ambulatory Visit (HOSPITAL_COMMUNITY): Payer: Self-pay

## 2024-03-18 NOTE — Telephone Encounter (Signed)
 Deleted the PA- pt has nexlizet  as an allergy listed

## 2024-03-18 NOTE — Telephone Encounter (Signed)
 Pharmacy Patient Advocate Encounter   Received notification from CoverMyMeds that prior authorization for Nexlizet  is required/requested.   Insurance verification completed.   The patient is insured through [employer].   Per test claim: PA required; PA submitted to above mentioned insurance via Latent Key/confirmation #/EOC [id number] Status is pending

## 2024-04-28 DIAGNOSIS — R29898 Other symptoms and signs involving the musculoskeletal system: Secondary | ICD-10-CM | POA: Insufficient documentation

## 2024-05-13 ENCOUNTER — Other Ambulatory Visit: Payer: Self-pay | Admitting: Cardiology

## 2024-05-13 DIAGNOSIS — R931 Abnormal findings on diagnostic imaging of heart and coronary circulation: Secondary | ICD-10-CM

## 2024-06-02 ENCOUNTER — Other Ambulatory Visit: Payer: Self-pay

## 2024-06-03 ENCOUNTER — Encounter: Payer: Self-pay | Admitting: Cardiology

## 2024-06-03 ENCOUNTER — Ambulatory Visit: Admitting: Cardiology

## 2024-06-03 VITALS — BP 128/80 | HR 72 | Ht 66.0 in | Wt 184.8 lb

## 2024-06-03 DIAGNOSIS — I10 Essential (primary) hypertension: Secondary | ICD-10-CM | POA: Diagnosis not present

## 2024-06-03 DIAGNOSIS — R931 Abnormal findings on diagnostic imaging of heart and coronary circulation: Secondary | ICD-10-CM

## 2024-06-03 DIAGNOSIS — E785 Hyperlipidemia, unspecified: Secondary | ICD-10-CM | POA: Diagnosis not present

## 2024-06-03 NOTE — Progress Notes (Unsigned)
 " Cardiology Office Note:    Date:  06/03/2024   ID:  Stephanie Cobb, Stephanie Cobb 1960/08/20, MRN [id number]  PCP:  Erick Greig LABOR, NP  Cardiologist:  Lamar Fitch, MD    Referring MD: Erick Greig LABOR, NP   No chief complaint on file. Doing fine  History of Present Illness:    Stephanie Cobb is a 63 y.o. female past medical history significant for atypical chest pain, stress test negative, elevated calcium score of 14 which is 66 percentile, dyslipidemia, essential hypertension, fibromyalgia.  Comes today to months for follow-up overall doing fine.  Denies of any chest pain tightness squeezing pressure burning chest, she goes to physical therapy with no difficulties.  Past Medical History:  Diagnosis Date   ADD (attention deficit disorder)    Anemia    Angina pectoris 09/21/2021   Anxiety    Arthritis    OA / PAIN BOTH HIPS   Capsulitis of metatarsophalangeal (MTP) joint of right foot 11/21/2015   Depression    Dyslipidemia 09/21/2021   Elevated coronary artery calcium score 03/06/2022   Expected blood loss anemia 02/24/2014   Female stress incontinence 02/11/2023   Fibromyalgia    Full thickness rotator cuff tear 05/20/2021   GERD (gastroesophageal reflux disease)    Heart palpitations    NUCLEAR STRESS TEST 12/14/13 AT [facility]- NO ISCHEMIA, EF 74%   Hypercholesterolemia 03/18/2018   Hypertension    Hypertensive disorder 03/18/2018   Hypertriglyceridemia    Hypothyroidism    Insomnia    Kidney insufficiency    PT STATES BUN SLIGHTLY ELEVATED - HER DOCTORS ARE WATCHING   Lumbar radiculopathy 04/01/2018   Menopausal symptom 07/25/2021   Metatarsalgia of right foot 11/21/2015   Migraine 03/18/2018   MRSA (methicillin resistant Staphylococcus aureus)    hx of 2012- left upper leg - treated with Vancomycin     Multifocal pneumonia 05/26/2019   Olecranon bursitis 02/26/2022   Osteoarthritis of carpometacarpal (CMC) joint of thumb 02/26/2022   Osteoarthritis of left knee  01/25/2021   Overweight (BMI 25.0-29.9) 01/13/2014   Pain    CHRONIC NECK PAIN - DDD CERVICOTHORACIC; HX OF CERVICA FUSION - PT STATES 2 AREAS HAVE NOT FUSED AND SHE WILL NEED FURTHER SURGERY IN THE FUTURE   Pain in joint of left knee 11/25/2020   Pain in thumb joint with movement of left hand 02/26/2022   Personal history of COVID-19 05/05/2019   Pneumonia    ABOUT 6 YRS AGO   Pneumonia due to COVID-19 virus 05/26/2019   PONV (postoperative nausea and vomiting)    Primary osteoarthritis of both hips 10/29/2013   Recurrent falls 09/25/2022   S/P right THA, 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   Scoliosis     Past Surgical History:  Procedure Laterality Date   ABDOMINAL HYSTERECTOMY     CERVICAL FUSION  2011   TOTAL HIP ARTHROPLASTY Left 01/12/2014   Procedure: LEFT TOTAL HIP ARTHROPLASTY ANTERIOR APPROACH;  Surgeon: Donnice JONETTA Car, MD;  Location: [facility];  Service: Orthopedics;  Laterality: Left;   TOTAL HIP ARTHROPLASTY Right 02/23/2014   Procedure: RIGHT TOTAL HIP ARTHROPLASTY ANTERIOR APPROACH;  Surgeon: Donnice JONETTA Car, MD;  Location: [facility];  Service: Orthopedics;  Laterality: Right;   TUBAL LIGATION      Current Medications: Active Medications[1]   Allergies:   Mupirocin, Nexlizet  [bempedoic acid-ezetimibe ], Other, Oxycodone , and Vicodin [hydrocodone -acetaminophen ]   Social History   Socioeconomic History   Marital status: Married    Spouse name: Not on file  Number of children: Not on file   Years of education: Not on file   Highest education level: Not on file  Occupational History   Not on file  Tobacco Use   Smoking status: Former    Current packs/day: 1.00    Average packs/day: 1 pack/day for 10.0 years (10.0 ttl pk-yrs)    Types: Cigarettes   Smokeless tobacco: Never  Substance and Sexual Activity   Alcohol use: Not Currently    Comment: Socially   Drug use: Never   Sexual activity: Yes  Other Topics Concern   Not on file  Social History Narrative   Not on file   Social  Drivers of Health   Tobacco Use: Medium Risk (06/03/2024)   Patient History    Smoking Tobacco Use: Former    Smokeless Tobacco Use: Never    Passive Exposure: Not on [place] Strain: Not on file  Food Insecurity: Not on file  Transportation Needs: Not on file  Physical Activity: Not on file  Stress: Not on file  Social Connections: Unknown (10/17/2021)   [facility]   Social Network    Social Network: Not on file  Depression (PHQ2-9): Not on file  Alcohol Screen: Not on file  Housing: Not on file  Utilities: Not on file  Health Literacy: Not on file     Family History: The patient's family history includes Heart attack in her father and mother; Hypertension in her brother, father, and mother; Stroke in her brother. There is no history of Diabetes or Cancer. ROS:   Please see the history of present illness.    All 14 point review of systems negative except as described per history of present illness  EKGs/Labs/Other Studies Reviewed:         Recent Labs: 10/30/2023: BUN 17; Creatinine, Ser 1.21; Potassium 4.9; Sodium 130  Recent Lipid Panel    Component Value Date/Time   CHOL 202 (H) 01/09/2023 1447   TRIG 106 01/09/2023 1447   HDL 90 01/09/2023 1447   CHOLHDL 2.2 01/09/2023 1447   LDLCALC 94 01/09/2023 1447    Physical Exam:    VS:  BP 128/80   Pulse 72   Ht 5' 6 (1.676 m)   Wt 184 lb 12.8 oz (83.8 kg)   SpO2 98%   BMI 29.83 kg/m     Wt Readings from Last 3 Encounters:  06/03/24 184 lb 12.8 oz (83.8 kg)  01/29/24 180 lb 9.6 oz (81.9 kg)  11/12/23 179 lb (81.2 kg)     GEN:  Well nourished, well developed in no acute distress HEENT: Normal NECK: No JVD; No carotid bruits LYMPHATICS: No lymphadenopathy CARDIAC: RRR, no murmurs, no rubs, no gallops RESPIRATORY:  Clear to auscultation without rales, wheezing or rhonchi  ABDOMEN: Soft, non-tender, non-distended MUSCULOSKELETAL:  No edema; No deformity  SKIN: Warm and  dry LOWER EXTREMITIES: no swelling NEUROLOGIC:  Alert and oriented x 3 PSYCHIATRIC:  Normal affect   ASSESSMENT:    1. Elevated coronary artery calcium score   2. Primary hypertension   3. Dyslipidemia    PLAN:    In order of problems listed above:  Calcification of the coronary completely asymptomatic encouraged her to be active when she is trying to exercise on a regular basis. Essential hypertension, blood pressure well-controlled continue present management. Dyslipidemia I did review her last lab work test, LDL 95 HDl 21, continue present management   Medication Adjustments/Labs and Tests Ordered: Current medicines are  reviewed at length with the patient today.  Concerns regarding medicines are outlined above.  No orders of the defined types were placed in this encounter.  Medication changes: No orders of the defined types were placed in this encounter.   Signed, Lamar DOROTHA Fitch, MD, [facility] 06/03/2024 4:40 PM    [facility]     [1]  Current Meds  Medication Sig   amLODipine  (NORVASC ) 5 MG tablet Take 2 tablets (10 mg total) by mouth in the morning and at bedtime.   aspirin  EC 81 MG tablet Take 1 tablet (81 mg total) by mouth daily. Swallow whole.   buPROPion  (WELLBUTRIN  XL) 300 MG 24 hr tablet Take 300 mg by mouth every morning.   carisoprodol  (SOMA ) 350 MG tablet Take 350 mg by mouth at bedtime.    cloNIDine (CATAPRES) 0.1 MG tablet Take 0.1 mg by mouth as needed (BP 170or >).   cyclobenzaprine (FLEXERIL) 10 MG tablet Take 10 mg by mouth 3 (three) times daily as needed.   diazepam  (VALIUM ) 5 MG tablet Take 5 mg by mouth every 8 (eight) hours as needed for muscle spasms (headaches).   DULoxetine  (CYMBALTA ) 30 MG capsule Take 30 mg by mouth daily.   DULoxetine  (CYMBALTA ) 60 MG capsule Take 90 mg by mouth every morning.   estradiol  (ESTRACE ) 2 MG tablet Take 1 mg by mouth daily. Taking 1/2 tablet daily   ezetimibe  (ZETIA ) 10 MG tablet Take 1  tablet (10 mg total) by mouth daily.   gabapentin  (NEURONTIN ) 300 MG capsule Take 300 mg by mouth every 4 (four) hours as needed (Nerve pain).   levothyroxine  (SYNTHROID ) 75 MCG tablet Take 75 mcg by mouth daily.   linaCLOtide (LINZESS PO) Take 1 tablet by mouth as needed (constipation).   Magnesium  250 MG TABS Take 1 tablet by mouth daily.   metoprolol  tartrate (LOPRESSOR ) 25 MG tablet TAKE 1 TABLET(25 MG) BY MOUTH TWICE DAILY   montelukast  (SINGULAIR ) 10 MG tablet Take 10 mg by mouth every morning.    Multiple Vitamins-Minerals (CENTRUM WOMEN PO) Take 1 tablet by mouth daily.   nabumetone (RELAFEN) 500 MG tablet Take 500 mg by mouth 2 (two) times daily.   pantoprazole  (PROTONIX ) 40 MG tablet Take 40 mg by mouth at bedtime.    ramipril  (ALTACE ) 10 MG capsule Take 10 mg by mouth daily.   rizatriptan  (MAXALT -MLT) 10 MG disintegrating tablet Take 10 mg by mouth every 2 (two) hours as needed for migraine. May repeat in 2 hours if needed   "

## 2024-06-03 NOTE — Patient Instructions (Signed)

## 2024-06-25 ENCOUNTER — Other Ambulatory Visit: Payer: Self-pay | Admitting: Cardiology
